# Patient Record
Sex: Female | Born: 1985 | Race: White | Hispanic: No | Marital: Married
Health system: Southern US, Community
[De-identification: ages and names within clinical notes are randomized; demographics above are authoritative.]

## PROBLEM LIST (undated history)

## (undated) DIAGNOSIS — F419 Anxiety disorder, unspecified: Secondary | ICD-10-CM

## (undated) DIAGNOSIS — C539 Malignant neoplasm of cervix uteri, unspecified: Secondary | ICD-10-CM

## (undated) DIAGNOSIS — N200 Calculus of kidney: Secondary | ICD-10-CM

## (undated) DIAGNOSIS — M549 Dorsalgia, unspecified: Secondary | ICD-10-CM

## (undated) DIAGNOSIS — F329 Major depressive disorder, single episode, unspecified: Secondary | ICD-10-CM

## (undated) DIAGNOSIS — F32A Depression, unspecified: Secondary | ICD-10-CM

## (undated) HISTORY — PX: KIDNEY STONE SURGERY: SHX686

## (undated) HISTORY — PX: ABDOMINAL HYSTERECTOMY: SHX81

## (undated) HISTORY — PX: RHINOPLASTY: SUR1284

## (undated) HISTORY — PX: TONSILLECTOMY: SUR1361

---
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
## 2009-01-02 ENCOUNTER — Encounter: Admission: RE | Admit: 2009-01-02 | Discharge: 2009-01-02 | Payer: Self-pay | Admitting: Family Medicine

## 2009-01-19 ENCOUNTER — Ambulatory Visit: Payer: Self-pay | Admitting: Internal Medicine

## 2009-01-26 ENCOUNTER — Ambulatory Visit: Payer: Self-pay | Admitting: Internal Medicine

## 2009-02-02 ENCOUNTER — Emergency Department (HOSPITAL_COMMUNITY): Admission: EM | Admit: 2009-02-02 | Discharge: 2009-02-02 | Payer: Self-pay | Admitting: Emergency Medicine

## 2009-02-04 ENCOUNTER — Inpatient Hospital Stay (HOSPITAL_COMMUNITY): Admission: EM | Admit: 2009-02-04 | Discharge: 2009-02-06 | Payer: Self-pay | Admitting: Emergency Medicine

## 2010-04-29 ENCOUNTER — Encounter (INDEPENDENT_AMBULATORY_CARE_PROVIDER_SITE_OTHER): Payer: Self-pay | Admitting: Obstetrics & Gynecology

## 2010-04-29 ENCOUNTER — Ambulatory Visit (HOSPITAL_COMMUNITY): Admission: RE | Admit: 2010-04-29 | Discharge: 2010-04-30 | Payer: Self-pay | Admitting: Obstetrics & Gynecology

## 2010-05-12 ENCOUNTER — Emergency Department (HOSPITAL_COMMUNITY): Admission: EM | Admit: 2010-05-12 | Discharge: 2010-05-12 | Payer: Self-pay | Admitting: Emergency Medicine

## 2010-07-18 NOTE — Discharge Summary (Addendum)
  NAMEMERRILEE, Ashley [name] NO.:  [id number]  MEDICAL RECORD NO.:  [id number]          PATIENT TYPE:  OIB  LOCATION:  9303                          FACILITY:  [facility]  PHYSICIAN:  Freddy Finner, M.D.   DATE OF BIRTH:  Jul 03, 1985  DATE OF ADMISSION:  04/29/2010 DATE OF DISCHARGE:  04/30/2010                              DISCHARGE SUMMARY   DISCHARGE DIAGNOSES:  Cervical intraepithelial neoplasia of cervix, history of pelvic pain, history of menorrhagia when intrauterine device not in place.  OPERATIVE PROCEDURE:  Laparoscopically-assisted vaginal hysterectomy.  DISPOSITION:  The patient is in satisfactory and improved condition on first postoperative day.  She was discharged home to restricted activity including vaginal rest.  No heavy lifting.  She is to call for fever or heavy vaginal bleeding.  She is to return to the office in approximately 1 week for postoperative followup.  She is given Percocet to be taken as needed for postoperative pain.  Surgical pathology is pending at the time of discharge.  The patient is known to have mild CIN on cervical biopsy.  The cervix will be, cut as a cone biopsy for further delineation of the degree of change.  Laboratory data during this admission includes a positive MRSA by PCR which was managed according to the standard protocol hospital.  On admission, CBC was completely normal with hemoglobin of 13. Postoperative hemoglobin was 10.1.  Details of the present illness, past history, family history, review of systems and physical exam were recorded in the admission note.  Briefly, the patient presented to my office with a request for definite surgical intervention for cervical intraepithelial neoplasia.  She is multiparas with 4 children.  Does not wish to ever have children again and feels confident that this is the best way of approaches from her perspective.  Her physical findings on admission were normal.  The uterus  itself had been slightly enlarged but no definite abnormality.  HOSPITAL COURSE:  The patient was admitted on the morning of surgery. She was given a bolus of Ancef.  She was placed in PAS hose.  She was brought to the operating room where she was placed under adequate general endotracheal anesthesia.  The above-described surgical procedure was accomplished without significant intraoperative complications.  Her postoperative course was entirely satisfactory.  She remained afebrile throughout her postoperative state.  After admission, she was ambulating without assistance, tolerating regular diet.  She was having adequate bowel and bladder function.  She was adequately relieved pain with Percocet which she is given for discharge.  She is to return to the office in approximately 1 week for postoperative followup.     Freddy Finner, M.D.     WRN/MEDQ  D:  04/30/2010  T:  05/01/2010  Job:  [id number]  Electronically Signed by W. NEAL M.D. on 07/18/2010 08:51:53 AM

## 2010-07-18 NOTE — H&P (Addendum)
  NAMEMOE, BRIER NO.:  [id number]  MEDICAL RECORD NO.:  [id number]         PATIENT TYPE:  WOIB  LOCATION:                                FACILITY:  [facility]  PHYSICIAN:  Freddy Finner, M.D.   DATE OF BIRTH:  Apr 29, 1986  DATE OF ADMISSION:  04/29/2010 DATE OF DISCHARGE:                             HISTORY & PHYSICAL   ADMISSION DIAGNOSIS:  Cervical intraepithelial neoplasia with a history of high-grade lesion and recent colposcopically directed biopsies showing a low-grade lesion.  The patient is a 25 year old with 4 living children who is using an IUD for contraception.  She was evaluated by her regular family physician and had a colposcopically directed biopsy showing low-grade CIN.  Pap smear has shown suggestion of a high-grade lesion.  The patient was seen in consultation by Dr. Sydnee Cabal and options of therapy discussed with her.  These options included cold-knife conization of the cervix or hysterectomy.  I have reviewed both with the patient in the office.  She adamantly states that she wishes to not have periods any longer and reduce her risk of cervical cancer.  She has requested strongly that she have a hysterectomy.  She is admitted now for laparoscopic-assisted vaginal hysterectomy.  Current review of systems is otherwise negative.  There are no cardiopulmonary, GI, or GU complaints.  The patient has had vaginal births.  No previous surgical procedures.  She is allergic to LATEX and had another product which I cannot identify.  She is on no medications on a chronic basis.  She does have a history of pelvic pain, but no documented pathology.  She has never had a blood transfusion.  FAMILY HISTORY:  Noncontributory.  PHYSICAL EXAM:  HEENT:  Grossly within normal limits. NECK:  Thyroid gland is not palpably enlarged. CHEST:  Clear to auscultation throughout. HEART:  Normal sinus rhythm without murmurs, rubs, or gallops. ABDOMEN:  Soft and  nontender without appreciable organomegaly or palpable masses. PELVIC:  Considered to be normal. External genitalia, vagina, and cervix are normal.  Her uterus is not palpably enlarged.  No palpable adnexal masses. RECTUM AND RECTOVAGINAL:  Confirmed the above findings.  ASSESSMENT:  Cervical intraepithelial neoplasia, multiparity, request for permanent sterilization, history of pelvic pain, request for hysterectomy as method of sterilization and resolution of problem.  The risk of hysterectomy have been discussed with the patient including risk of infection, blood clots, hemorrhage, and injury to other organs.  Prophylactic measures have also been discussed including serial compression devices and IV antibiotics.  She is admitted now, prepared to proceed with surgery.     Freddy Finner, M.D.     WRN/MEDQ  D:  04/26/2010  T:  04/27/2010  Job:  [id number]  Electronically Signed by W. NEAL M.D. on 07/18/2010 08:51:47 AM

## 2010-07-18 NOTE — Op Note (Addendum)
Ashley [name], Ashley [name] NO.:  [id number]  MEDICAL RECORD NO.:  [id number]          PATIENT TYPE:  OIB  LOCATION:  9303                          FACILITY:  [facility]  PHYSICIAN:  Freddy Finner, M.D.   DATE OF BIRTH:  08/26/1985  DATE OF PROCEDURE:  04/29/2010 DATE OF DISCHARGE:                              OPERATIVE REPORT   PREOPERATIVE DIAGNOSIS:  Cervical intraepithelial neoplasia of cervix.  POSTOPERATIVE DIAGNOSES:  Cervical intraepithelial neoplasia of cervix, clinical history of menorrhagia when intrauterine device not in place.  OPERATIVE PROCEDURES:  Laparoscopic-assisted vaginal hysterectomy and removal of intrauterine (contraceptive) device prior to the laparoscopic- assisted vaginal hysterectomy.  SURGEON:  Freddy Finner, MD  ASSISTANT:  Luvenia Redden, MD  ESTIMATED INTRAOPERATIVE BLOOD LOSS:  250 mL.  INTRAOPERATIVE COMPLICATIONS:  None.  DETAILS OF THE PRESENT ILLNESS RECORDED IN THE ADMISSION NOTE:  The patient was admitted on the morning of surgery.  She was placed in PAS hose.  She was given a bolus of Ancef IV.  She was brought to the operating room there and placed under adequate general endotracheal anesthesia, placed in the dorsal lithotomy position using the [place] stirrup system.  Betadine prep of abdomen, perineum, and vagina was carried out with scrub followed by solution.  Bladder was evacuated with a Robinson catheter.  Hulka tenaculum was attached to the cervix under direct visualization.  Sterile drapes were applied.  Two small incisions were made one at the umbilicus and one just above the symphysis. Through the upper incision, the disposable Veress needle was introduced and pneumoperitoneum was allowed to accumulate to at least 1 L of carbon dioxide.  The anterior abdominal wall was then manually elevated and an 11-mm trocar which was sharp disposable was introduced into the umbilicus without difficulty and with no evidence of  injury on entry. Pneumoperitoneum was allowed to continue accumulation.  A second trocar was placed in the lower incision.  This wound was a 5-mm ablated. Through it a blunt probe and spring-loaded grasping forceps and later the Nezhat irrigation system were used.  Systematic examination of the pelvic and abdominal contents was carried out.  Photographs were made. All intra-abdominal surfaces appeared to be normal.  The uterus may be slightly enlarged.  The ovaries and tubes were normal.  The appendix was normal.  The liver was normal.  No apparent abnormality was noted.  The tripolar Ethicon device was then __________ was introduced through the operating channel of the laparoscope and serial pedicles were developed, sealed and divided using this instrument.  Both the utero-ovarian ligament, the round ligament, the fallopian tube, the upper broad ligament on each side were progressively sealed and divided down to a level just above the uterine arteries.  Attention was then turned vaginally.  Posterior weighted vaginal retractor was placed.  Hulka was removed and replaced with a Christella Hartigan.  Colpotomy incision was made while tenting a mucosa posterior to the cervix.  The long blade narrow retractor was then placed.  The scalpel was then used to release the mucosa from the cervix.  The LigaSure device was then used to seal and  divide the uterosacral pedicles and bladder pillars.  The bladder was carefully advanced off the lower uterine segment.  Cardinal ligament pedicles were taken sealed and divided using the LigaSure.  Anterior peritoneum was then entered.  A vessel pedicles were then sealed and divided on each side.  An additional 1-2 pedicles on each side was required for complete resection of the uterus.  That was delivered through the vaginal introitus.  Angles of the vagina were then anchored to the uterosacrals with mattress sutures of 0 Monocryl.  Posterior peritoneum was closed and  uterosacrals plicated with a single interrupted 0 Monocryl suture.  Cuff was closed vertically with figure- of-eights of 0 Monocryl.  Foley catheter was placed.  Reinspection was carried out laparoscopically using the Nezhat system.  Approximately, 3 tiny bleeders were controlled with the Ethicon device.  Hemostasis was confirmed under reduced intra-abdominal pressure and irrigation. Irrigating solution was aspirated from the abdomen.  Gas was allowed to escape.  Incisions were anesthetized with 0.25% plain Marcaine and closed with interrupted subcuticular sutures of 3-0 Vicryl.  The Steri- Strips were applied to the lower incision.  The patient was awakened, taken to recovery in good condition.     Freddy Finner, M.D.     WRN/MEDQ  D:  04/29/2010  T:  04/29/2010  Job:  [id number]  Electronically Signed by Aden Sek. Lynzee Lindquist M.D. on 07/18/2010 08:51:49 AM

## 2010-09-03 LAB — SURGICAL PCR SCREEN: Staphylococcus aureus: POSITIVE — AB

## 2010-09-03 LAB — POCT I-STAT, CHEM 8
Calcium, Ion: 1.22 mmol/L (ref 1.12–1.32)
Creatinine, Ser: 0.9 mg/dL (ref 0.4–1.2)
Hemoglobin: 11.9 g/dL — ABNORMAL LOW (ref 12.0–15.0)
Sodium: 143 mEq/L (ref 135–145)
TCO2: 27 mmol/L (ref 0–100)

## 2010-09-03 LAB — CBC
MCH: 30.3 pg (ref 26.0–34.0)
MCHC: 34.3 g/dL (ref 30.0–36.0)
RDW: 12.6 % (ref 11.5–15.5)

## 2010-09-03 LAB — HEMOCCULT GUIAC POC 1CARD (OFFICE): Fecal Occult Bld: POSITIVE

## 2010-09-04 LAB — CBC
MCH: 29.8 pg (ref 26.0–34.0)
MCV: 88.2 fL (ref 78.0–100.0)
Platelets: 175 10*3/uL (ref 150–400)
RBC: 4.36 MIL/uL (ref 3.87–5.11)
RDW: 12.7 % (ref 11.5–15.5)

## 2010-09-28 LAB — BASIC METABOLIC PANEL
BUN: 13 mg/dL (ref 6–23)
Calcium: 8.5 mg/dL (ref 8.4–10.5)
Chloride: 107 mEq/L (ref 96–112)
Creatinine, Ser: 0.91 mg/dL (ref 0.4–1.2)
GFR calc Af Amer: >60 mL/min (ref >60)
GFR calc non Af Amer: >60 mL/min (ref >60)

## 2010-09-28 LAB — CBC
MCV: 86.2 fL (ref 78.0–100.0)
Platelets: 157 10*3/uL (ref 150–400)
RBC: 3.76 MIL/uL — ABNORMAL LOW (ref 3.87–5.11)
WBC: 4.1 10*3/uL (ref 4.0–10.5)

## 2010-09-28 LAB — HCG, QUANTITATIVE, PREGNANCY: hCG, Beta Chain, Quant, S: <2 m[IU]/mL (ref <5)

## 2010-09-29 LAB — CBC
MCHC: 34.9 g/dL (ref 30.0–36.0)
MCHC: 34.9 g/dL (ref 30.0–36.0)
MCV: 86.3 fL (ref 78.0–100.0)
RBC: 3.92 MIL/uL (ref 3.87–5.11)
RBC: 4.31 MIL/uL (ref 3.87–5.11)
RDW: 12.4 % (ref 11.5–15.5)
RDW: 12.6 % (ref 11.5–15.5)

## 2010-09-29 LAB — BASIC METABOLIC PANEL
CO2: 23 mEq/L (ref 19–32)
CO2: 26 mEq/L (ref 19–32)
Calcium: 8.9 mg/dL (ref 8.4–10.5)
Calcium: 9.1 mg/dL (ref 8.4–10.5)
Chloride: 111 mEq/L (ref 96–112)
Creatinine, Ser: 0.89 mg/dL (ref 0.4–1.2)
Creatinine, Ser: 0.92 mg/dL (ref 0.4–1.2)
GFR calc Af Amer: >60 mL/min (ref >60)
GFR calc Af Amer: >60 mL/min (ref >60)
GFR calc non Af Amer: >60 mL/min (ref >60)
Glucose, Bld: 88 mg/dL (ref 70–99)
Glucose, Bld: 98 mg/dL (ref 70–99)

## 2010-09-29 LAB — URINALYSIS, ROUTINE W REFLEX MICROSCOPIC
Leukocytes, UA: NEGATIVE
Leukocytes, UA: NEGATIVE
Nitrite: NEGATIVE
Nitrite: NEGATIVE
Specific Gravity, Urine: 1.02 (ref 1.005–1.030)
Specific Gravity, Urine: >1.03 — ABNORMAL HIGH (ref 1.005–1.030)
Urobilinogen, UA: 0.2 mg/dL (ref 0.0–1.0)
Urobilinogen, UA: 0.2 mg/dL (ref 0.0–1.0)

## 2010-09-29 LAB — DIFFERENTIAL
Basophils Absolute: 0 10*3/uL (ref 0.0–0.1)
Basophils Relative: 0 % (ref 0–1)
Lymphocytes Relative: 19 % (ref 12–46)
Neutro Abs: 3.9 10*3/uL (ref 1.7–7.7)
Neutrophils Relative %: 75 % (ref 43–77)

## 2010-09-29 LAB — PREGNANCY, URINE: Preg Test, Ur: NEGATIVE

## 2010-09-29 LAB — URINE MICROSCOPIC-ADD ON

## 2010-11-05 NOTE — H&P (Signed)
[name], [name]                 ACCOUNT NO.:  [id number]   MEDICAL RECORD NO.:  [id number]          PATIENT TYPE:  INP   LOCATION:  A212                          FACILITY:  [facility]   PHYSICIAN:  Ky Barban, M.D.DATE OF BIRTH:  07/06/1985   DATE OF ADMISSION:  02/04/2009  DATE OF DISCHARGE:  LH                              HISTORY & PHYSICAL   CHIEF COMPLAINT:  Recurrent left renal colic.   HISTORY:  This 25 year old female has severe pain in left flank on  Friday, came to the emergency room, was told she has a 7-mm stone, was  sent home, came back again with left renal colic and not feeling well.  No fever, chills, or voiding difficulty or gross hematuria.  CT scan  shows again 7-mm left distal ureteral calculus with hydroureter  nephrosis.  The patient is given pain medicine Dilaudid.  She has  itching because of that, but continued to have pain.  She is admitted  for further management and further control of this pain.   PAST MEDICAL HISTORY:  Except having a benign cyst removed on the skin,  no other significant medical problem.  She did require blood transfusion  during one of her normal delivery.   FAMILY HISTORY:  Negative.  No history of kidney stones.   PERSONAL HISTORY:  Does not smoke or drink.   REVIEW OF SYSTEMS:  Unremarkable.   PHYSICAL EXAMINATION:  GENERAL:  Well-nourished, well-developed female.  VITAL SIGNS:  Blood pressure 130/80, temperature is normal.  CENTRAL NERVOUS SYSTEM:  Negative.  HEAD, NECK, EYES, AND ENT:  Negative.  CHEST:  Symmetrical.  HEART:  Regular sinus rhythm.  No murmur.  ABDOMEN:  Soft and flat.  Liver, spleen, kidneys not palpable.  1+ left  CVA tenderness.  PELVIC:  Deferred.  EXTREMITIES:  Normal.   IMPRESSION:  Left ureteral calculus.   PLAN:  IV fluids, parenteral analgesia, and scheduled her for a  cystoscopy, left retrograde pyelogram, ureteroscopic stone basket under  anesthesia, holmium laser lithotripsy, and  possible double-J stent.  I  discussed various treatment options including:  1. Waiting.  2. ESL.  3. Stone basket and I discussed in detail stone basket complications,      ureteral perforation leading to open surgery.  She understands,      want me to go ahead and schedule.      Ky Barban, M.D.  Electronically Signed     MIJ/MEDQ  D:  02/04/2009  T:  02/05/2009  Job:  [id number]

## 2010-11-05 NOTE — Op Note (Signed)
NAMEGEORGIANNA, BAND                 ACCOUNT NO.:  [id number]   MEDICAL RECORD NO.:  [id number]          PATIENT TYPE:  INP   LOCATION:  A212                          FACILITY:  [facility]   PHYSICIAN:  Ky Barban, M.D.DATE OF BIRTH:  1986-02-02   DATE OF PROCEDURE:  DATE OF DISCHARGE:  02/06/2009                               OPERATIVE REPORT   PREOPERATIVE DIAGNOSIS:  Left ureteral calculus.   POSTOPERATIVE DIAGNOSIS:  Normal left retrograde pyelogram.  No stone.   PROCEDURE:  Cystoscopy, left retrograde pyelogram, left ureteral  renoscopy, insertion of double-J stent.   ANESTHESIA:  General.   SURGEON:  Ky Barban, MD   PROCEDURE:  The patient under general anesthesia in lithotomy position.  After usual prep and drape, #25 cystoscope introduced into the bladder.  Both ureteral orifices located at normal side with clear efflux of  bladder, grossly looks normal left ureteral orifice catheterized with a  wedge catheter Hypaque was injected under fluoroscopic control.  Dye  goes up into the upper ureter.  Upper ureter was normal in caliber.  Distal ureter was slightly distended, but I do not see any filling  defect in the entire ureter.  I decided to look into the ureter.  Guidewire was passed up into the renal pelvis.  Intramural ureter was  dilated with balloon dilator.  A #15 rigid ureteroscope was entered.  I  was able to go into the upper ureter, not all the way into the renal  pelvis.  I did not encounter any stone or any obstruction.  Rigid  ureteroscope was removed.  Then, I used flexible ureteroscope over the  guidewire.  I went all the way into the renal pelvis.  There was no  stone in the ureter.  I did not see any stone in the renal pelvis or the  calyces, and at this point the ureteroscope was removed.  I decided to  leave a double-J stent.  Double-J stent was positioned within the renal  pelvis and the bladder under fluoroscopic control.  The string has  been  removed.  All the instruments were removed.  The patient left the  operating room in satisfactory condition.      Ky Barban, M.D.  Electronically Signed     MIJ/MEDQ  D:  02/06/2009  T:  02/07/2009  Job:  [id number]

## 2012-04-19 ENCOUNTER — Encounter: Payer: Self-pay | Admitting: *Deleted

## 2012-04-19 ENCOUNTER — Emergency Department (INDEPENDENT_AMBULATORY_CARE_PROVIDER_SITE_OTHER): Payer: Medicaid Other

## 2012-04-19 ENCOUNTER — Emergency Department (INDEPENDENT_AMBULATORY_CARE_PROVIDER_SITE_OTHER)
Admission: EM | Admit: 2012-04-19 | Discharge: 2012-04-19 | Disposition: A | Discharge status: Home / Self Care | Payer: Medicaid Other | Source: Home / Self Care | Attending: Emergency Medicine | Admitting: Emergency Medicine

## 2012-04-19 DIAGNOSIS — M25579 Pain in unspecified ankle and joints of unspecified foot: Secondary | ICD-10-CM

## 2012-04-19 DIAGNOSIS — M25571 Pain in right ankle and joints of right foot: Secondary | ICD-10-CM

## 2012-04-19 MED ORDER — MELOXICAM 7.5 MG PO TABS: 7.5000 mg | ORAL_TABLET | Freq: Two times a day (BID) | ORAL | Status: DC | PRN

## 2012-04-19 NOTE — ED Provider Notes (Signed)
History     CSN: [id number]  Arrival date & time 04/19/12  1103   First MD Initiated Contact with Patient 04/19/12 1103      Chief Complaint  Patient presents with  . Ankle Injury    (Consider location/radiation/quality/duration/timing/severity/associated sxs/prior treatment) HPI This is a 26 year old white female who presents today with right ankle pain for the last 3 months.  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  She also reports that her mom has developed a cancer and she is having to take care of her a lot.  She has described a constant dull ache in her right ankle mostly on the medial aspect for the last few months.  However last night she was at the pumpkin patch and twisted her ankle and it got a lot worse at that time.  She has been using some Aleve and ibuprofen which is helping a little bit.  She has not used any braces.  She does not have any history of ankle injuries or trauma in the past.   History reviewed. No pertinent past medical history.  Past Surgical History  Procedure Date  . Abdominal hysterectomy     Family History  Problem Relation Age of Onset  . Cancer Mother     breast    History  Substance Use Topics  . Smoking status: Never Smoker   . Smokeless tobacco: Not on file  . Alcohol Use: No    OB History    Grav Para Term Preterm Abortions TAB SAB Ect Mult Living                  Review of Systems  All other systems reviewed and are negative.    Allergies  Latex and Penicillins  Home Medications   Current Outpatient Rx  Name Route Sig Dispense Refill  . MELOXICAM 7.5 MG PO TABS Oral Take 1 tablet (7.5 mg total) by mouth 2 (two) times daily as needed for pain (BID for 1 week, then PRN afterwards). 30 tablet 0    BP 120/79  Pulse 81  Temp 98.1 F (36.7 C) (Oral)  Resp 16  Ht 5\' 3"  (1.6 m)  Wt 198 lb (89.812 kg)  BMI 35.07 kg/m2  SpO2 99%  Physical Exam    Nursing note and vitals reviewed. Constitutional: She is oriented to person, place, and time. She appears well-developed and well-nourished.  HENT:  Head: Normocephalic and atraumatic.  Eyes: No scleral icterus.  Neck: Neck supple.  Cardiovascular: Regular rhythm and normal heart sounds.   Pulmonary/Chest: Effort normal and breath sounds normal. No respiratory distress.  Musculoskeletal:       R ankle/foot: FROM, +TTP globally, even to light touch.  Worse is medial malleolus and ATFL.   No TTP navicular, base of 5th, calcaneus, Achilles, proximal fibula.  No swelling.  No ecchymoses.  Distal neurovascular status is intact.    Neurological: She is alert and oriented to person, place, and time.  Skin: Skin is warm and dry.  Psychiatric: She has a normal mood and affect. Her speech is normal.    ED Course  Procedures (including critical care time)  Labs Reviewed - No data to display Dg Ankle Complete Right  04/19/2012  *RADIOLOGY REPORT*  Clinical Data: Medial pain for 3 months.  Gave way.  RIGHT ANKLE - COMPLETE 3+ VIEW  Comparison: None.  Findings: No evidence of fracture, dislocation, joint  effusion or other focal finding.  IMPRESSION: Normal radiographs.   Original Report Authenticated By: Thomasenia Sales, M.D.      1. Right ankle pain       MDM   An x-ray was obtained and read by the radiologist as above. Encourage rest, ice, compression with ACE bandage and/or a brace, and elevation of injured body part.  The role of anti-inflammatories is discussed with the patient.  Because of her chronic pain for a few months and her examination in which she seems to be very tender, I'm going to put her in a walking boot for the next 7-10 days.  I'm also put her anti-inflammatory medicine.  At that time, if she isn't improving, would like her to follow up with sports medicine.  Marlaine Hind, MD 04/19/12 1153

## 2012-04-19 NOTE — ED Notes (Signed)
Pt c/o RT ankle pain on and off x 3 mths, worse x this weekend. she has taken Aleve and IBF.

## 2012-05-07 ENCOUNTER — Other Ambulatory Visit (HOSPITAL_COMMUNITY): Payer: Self-pay | Admitting: Family Medicine

## 2012-05-07 DIAGNOSIS — Z139 Encounter for screening, unspecified: Secondary | ICD-10-CM

## 2012-05-11 ENCOUNTER — Ambulatory Visit (HOSPITAL_COMMUNITY)
Admission: RE | Admit: 2012-05-11 | Discharge: 2012-05-11 | Disposition: A | Discharge status: Home / Self Care | Payer: Medicaid Other | Source: Ambulatory Visit | Attending: Family Medicine | Admitting: Family Medicine

## 2012-05-11 DIAGNOSIS — Z139 Encounter for screening, unspecified: Secondary | ICD-10-CM

## 2012-05-11 DIAGNOSIS — Z1231 Encounter for screening mammogram for malignant neoplasm of breast: Secondary | ICD-10-CM | POA: Insufficient documentation

## 2014-03-28 ENCOUNTER — Encounter: Payer: Self-pay | Admitting: Internal Medicine

## 2014-04-10 DIAGNOSIS — Z0000 Encounter for general adult medical examination without abnormal findings: Secondary | ICD-10-CM | POA: Insufficient documentation

## 2014-04-10 DIAGNOSIS — G43719 Chronic migraine without aura, intractable, without status migrainosus: Secondary | ICD-10-CM | POA: Insufficient documentation

## 2014-09-01 ENCOUNTER — Encounter: Payer: Self-pay | Admitting: *Deleted

## 2014-09-01 ENCOUNTER — Emergency Department
Admission: EM | Admit: 2014-09-01 | Discharge: 2014-09-01 | Disposition: A | Discharge status: Home / Self Care | Payer: Medicaid Other | Source: Home / Self Care | Attending: Emergency Medicine | Admitting: Emergency Medicine

## 2014-09-01 ENCOUNTER — Emergency Department (INDEPENDENT_AMBULATORY_CARE_PROVIDER_SITE_OTHER): Payer: Medicaid Other

## 2014-09-01 DIAGNOSIS — M545 Low back pain, unspecified: Secondary | ICD-10-CM

## 2014-09-01 MED ORDER — MELOXICAM 7.5 MG PO TABS: 7.5000 mg | ORAL_TABLET | Freq: Every day | ORAL | Status: DC

## 2014-09-01 MED ORDER — HYDROCODONE-ACETAMINOPHEN 5-325 MG PO TABS: 2.0000 | ORAL_TABLET | ORAL | Status: DC | PRN

## 2014-09-01 NOTE — ED Provider Notes (Signed)
CSN: [id number]     Arrival date & time 09/01/14  1530 History   First MD Initiated Contact with Patient 09/01/14 1604     Chief Complaint  Patient presents with  . Back Pain   (Consider location/radiation/quality/duration/timing/severity/associated sxs/prior Treatment) Patient is a 29 y.o. female presenting with back pain. The history is provided by the patient. No language interpreter was used.  Back Pain Location:  Lumbar spine Quality:  Aching Radiates to:  Does not radiate Pain severity:  Moderate Pain is:  Same all the time Onset quality:  Gradual Duration:  2 weeks Timing:  Constant Progression:  Worsening Chronicity:  New Relieved by:  Nothing Worsened by:  Nothing tried Ineffective treatments:  None tried Risk factors: no hx of cancer     History reviewed. No pertinent past medical history. Past Surgical History  Procedure Laterality Date  . Abdominal hysterectomy     Family History  Problem Relation Age of Onset  . Cancer Mother     breast   History  Substance Use Topics  . Smoking status: Never Smoker   . Smokeless tobacco: Not on file  . Alcohol Use: No   OB History    No data available     Review of Systems  Musculoskeletal: Positive for back pain.  All other systems reviewed and are negative.   Allergies  Latex and Penicillins  Home Medications   Prior to Admission medications   Medication Sig Start Date End Date Taking? Authorizing Provider  HYDROcodone-acetaminophen (NORCO/VICODIN) 5-325 MG per tablet Take 2 tablets by mouth every 4 (four) hours as needed. 09/01/14   Fransico Meadow, PA-C  meloxicam (MOBIC) 7.5 MG tablet Take 1 tablet (7.5 mg total) by mouth daily. 09/01/14   Fransico Meadow, PA-C   BP 118/84 mmHg  Pulse 76  Resp 14  Wt 195 lb (88.451 kg)  SpO2 100% Physical Exam  Constitutional: She is oriented to person, place, and time. She appears well-developed and well-nourished.  HENT:  Head: Normocephalic.  Eyes: Conjunctivae  and EOM are normal. Pupils are equal, round, and reactive to light.  Neck: Normal range of motion.  Cardiovascular: Normal rate and normal heart sounds.   Pulmonary/Chest: Effort normal.  Abdominal: She exhibits no distension.  Musculoskeletal:  Tender ls spine diffusely, decreased range of motion.  Reflexes normal,   Ns intact   Neurological: She is alert and oriented to person, place, and time.  Skin: Skin is warm.  Psychiatric: She has a normal mood and affect.  Nursing note and vitals reviewed.   ED Course  Procedures (including critical care time) Labs Review Labs Reviewed - No data to display  Imaging Review Dg Lumbar Spine Complete  09/01/2014   CLINICAL DATA:  Low back pain. Worse on the right-side. Symptoms for 2 years. Recently worsening. No trauma history submitted.  EXAM: LUMBAR SPINE - COMPLETE 4+ VIEW  COMPARISON:  10/20/2012  FINDINGS: Five lumbar type vertebral bodies. Large colonic stool burden incidentally noted. Sacroiliac joints are symmetric. Maintenance of vertebral body height and alignment. Intervertebral disc heights are maintained.  IMPRESSION: No acute osseous abnormality.  Possible constipation.   Electronically Signed   By: Abigail Miyamoto M.D.   On: 09/01/2014 16:40     MDM   1. Bilateral low back pain without sciatica    meloxicam Hydrocodone  Pt advised to see Dr. Darene Lamer for evaluation     Fransico Meadow, PA-C 09/01/14 1823

## 2014-09-01 NOTE — Discharge Instructions (Signed)
Back Pain, Adult Low back pain is very common. About 1 in 5 people have back pain.The cause of low back pain is rarely dangerous. The pain often gets better over time.About half of people with a sudden onset of back pain feel better in just 2 weeks. About 8 in 10 people feel better by 6 weeks.  CAUSES Some common causes of back pain include:  Strain of the muscles or ligaments supporting the spine.  Wear and tear (degeneration) of the spinal discs.  Arthritis.  Direct injury to the back. DIAGNOSIS Most of the time, the direct cause of low back pain is not known.However, back pain can be treated effectively even when the exact cause of the pain is unknown.Answering your caregiver's questions about your overall health and symptoms is one of the most accurate ways to make sure the cause of your pain is not dangerous. If your caregiver needs more information, he or she may order lab work or imaging tests (X-rays or MRIs).However, even if imaging tests show changes in your back, this usually does not require surgery. HOME CARE INSTRUCTIONS For many people, back pain returns.Since low back pain is rarely dangerous, it is often a condition that people can learn to manageon their own.   Remain active. It is stressful on the back to sit or stand in one place. Do not sit, drive, or stand in one place for more than 30 minutes at a time. Take short walks on level surfaces as soon as pain allows.Try to increase the length of time you walk each day.  Do not stay in bed.Resting more than 1 or 2 days can delay your recovery.  Do not avoid exercise or work.Your body is made to move.It is not dangerous to be active, even though your back may hurt.Your back will likely heal faster if you return to being active before your pain is gone.  Pay attention to your body when you bend and lift. Many people have less discomfortwhen lifting if they bend their knees, keep the load close to their bodies,and  avoid twisting. Often, the most comfortable positions are those that put less stress on your recovering back.  Find a comfortable position to sleep. Use a firm mattress and lie on your side with your knees slightly bent. If you lie on your back, put a pillow under your knees.  Only take over-the-counter or prescription medicines as directed by your caregiver. Over-the-counter medicines to reduce pain and inflammation are often the most helpful.Your caregiver may prescribe muscle relaxant drugs.These medicines help dull your pain so you can more quickly return to your normal activities and healthy exercise.  Put ice on the injured area.  Put ice in a plastic bag.  Place a towel between your skin and the bag.  Leave the ice on for 15-20 minutes, 03-04 times a day for the first 2 to 3 days. After that, ice and heat may be alternated to reduce pain and spasms.  Ask your caregiver about trying back exercises and gentle massage. This may be of some benefit.  Avoid feeling anxious or stressed.Stress increases muscle tension and can worsen back pain.It is important to recognize when you are anxious or stressed and learn ways to manage it.Exercise is a great option. SEEK MEDICAL CARE IF:  You have pain that is not relieved with rest or medicine.  You have pain that does not improve in 1 week.  You have new symptoms.  You are generally not feeling well. SEEK   IMMEDIATE MEDICAL CARE IF:   You have pain that radiates from your back into your legs.  You develop new bowel or bladder control problems.  You have unusual weakness or numbness in your arms or legs.  You develop nausea or vomiting.  You develop abdominal pain.  You feel faint. Document Released: 06/09/2005 Document Revised: 12/09/2011 Document Reviewed: 10/11/2013 Exit[employer] Patient Information 2015 [employer]. This information is not intended to replace advice given to you by your health care provider. Make sure you  discuss any questions you have with your health care provider.  

## 2014-09-01 NOTE — ED Notes (Signed)
Caleigh reports 4 year history of low back pain that is intermittent. This instance has been going on for 2 weeks. Pain radiates to right leg. 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

## 2014-09-05 ENCOUNTER — Encounter: Payer: Self-pay | Admitting: Sports Medicine

## 2014-09-05 ENCOUNTER — Ambulatory Visit (INDEPENDENT_AMBULATORY_CARE_PROVIDER_SITE_OTHER): Payer: Medicaid Other | Admitting: Sports Medicine

## 2014-09-05 VITALS — BP 125/78 | HR 82 | Ht 62.0 in | Wt 197.0 lb

## 2014-09-05 DIAGNOSIS — F329 Major depressive disorder, single episode, unspecified: Secondary | ICD-10-CM | POA: Diagnosis not present

## 2014-09-05 DIAGNOSIS — M5416 Radiculopathy, lumbar region: Secondary | ICD-10-CM

## 2014-09-05 DIAGNOSIS — M5417 Radiculopathy, lumbosacral region: Secondary | ICD-10-CM | POA: Diagnosis not present

## 2014-09-05 DIAGNOSIS — F32A Depression, unspecified: Secondary | ICD-10-CM | POA: Insufficient documentation

## 2014-09-05 DIAGNOSIS — M7918 Myalgia, other site: Secondary | ICD-10-CM | POA: Insufficient documentation

## 2014-09-05 MED ORDER — PREDNISONE (PAK) 10 MG PO TABS: ORAL_TABLET | ORAL | Status: DC

## 2014-09-05 MED ORDER — DULOXETINE HCL 30 MG PO CPEP: 30.0000 mg | ORAL_CAPSULE | Freq: Every day | ORAL | Status: DC

## 2014-09-05 MED ORDER — KETOROLAC TROMETHAMINE 30 MG/ML IJ SOLN
30.0000 mg | Freq: Once | INTRAMUSCULAR | Status: AC
  Administered 2014-09-05: 30 mg via INTRAMUSCULAR

## 2014-09-05 MED ORDER — CYCLOBENZAPRINE HCL 10 MG PO TABS: ORAL_TABLET | ORAL | Status: DC

## 2014-09-05 NOTE — Assessment & Plan Note (Signed)
And has been on venlafaxine in the past, self discontinued due to headaches. I'm going to start Cymbalta as I certainly think there is a myofascial component. Her PCP can take this over.

## 2014-09-05 NOTE — Progress Notes (Signed)
Subjective:    I'm seeing this patient as a consultation for:  Dr. Dione Housekeeper  CC:  Lower back pain  HPI: Patient presents with complaint of low back pain which had been intermittent for 4 years, but became constant 2 weeks ago. The pain is severe and worsened with forward flexion, sitting and laughing. The pain sometimes radiates down her right leg and to the inside, outside, and sole of her foot. She has associated swelling over her lower back. She has tried ibuprofen, hydrocodone, and meloxicam but these have failed to relieve her pain. She has difficulty getting comfortable at night and props herself up with pillows to sleep. She was recently seen in Urgent Care where she had an XR Lumbar Spine 09/01/14 which showed no acute osseous abnormality. The patient denies any saddle anesthesia, LE weakness, incontinence of urine or feces.  Incidentally, CT abdomen pelvis 02/02/2009 did show some evidence of L4/L5 disk herniation and S1 spina bifida occulta.    When asked about her mood lately, the patient admits that she had been treated for depression in the past but she recently weaned herself off of her venlafaxine because it exacerbated her migraines. She would be amenable to trying a different medication if it could help her pain. The patient's husband adds that she has been quite irritable lately.  Past medical history, Surgical history, Family history not pertinant except as noted below, Social history, Allergies, and medications have been entered into the medical record, reviewed, and no changes needed.   Review of Systems: No headache, visual changes, nausea, vomiting, diarrhea, constipation, dizziness, abdominal pain, skin rash, fevers, chills, night sweats, weight loss, swollen lymph nodes, body aches, joint swelling, muscle aches, chest pain, shortness of breath, mood changes, visual or auditory hallucinations.   Objective:   General: Well Developed, well nourished, and in no acute  distress.  Neuro/Psych: Alert and oriented x3, extra-ocular muscles intact, able to move all 4 extremities, sensation grossly intact. Patient is tearful on exam. Skin: Warm and dry, no rashes noted.  Respiratory: Not using accessory muscles, speaking in full sentences, trachea midline.  Cardiovascular: Pulses palpable, no extremity edema. Abdomen: Does not appear distended. MSK: Back Exam:  Inspection: approximately 12 cm by 5 cm ovoid-shaped swelling is visible at the midline approximately at the level of the iliac crests. There is no erythema or ecchymosis. Motion: Flexion 45 deg, Extension 45 deg, Side Bending to 45 deg bilaterally. SLR laying: Positive XSLR laying: Negative  Palpable tenderness: most severe over the midline swelling, also over SI joints bilaterally. Sensory change: Sensation to fine touch diminished over sole and lateral aspect of right foot. Reflexes: 2+ at both patellar tendons, 2+ at achilles tendons. Strength at foot  Plantar-flexion: 5/5 Dorsi-flexion: 5/5 Eversion: 5/5 Inversion: 5/5  Leg strength  Quad: 5/5 Hamstring: 5/5 Hip flexor: 5/5 Hip abductors: 5/5  Gait unremarkable.   Impression and Recommendations:   This case required medical decision making of moderate complexity.  # Lumbar Radiculopathy - The patient's symptoms correspond to the distribution of L4/5 and L5/S1 nerve roots, likely due to herniated disk and perhaps with some contribution from spina bifida. - In light of S1 spina bifida visible on CT abdomen pelvis 02/02/2009 will obtain MRI now. - Patient referred for formal physical therapy - Patient given Toradol 30 mg IV in office today - Continue meloxicam 7.5 mg daily  - Begin cyclobenzaprine 10 mg QHS  - Begin prednisone 10 mg and taper over 12 days  #  Depression - Patient with known history of depression, which may be contributing to the severity of her pain - Begin duloxetine 30 mg daily  - Patient to follow up management of depression  with her PCP  Follow up in 4 weeks or sooner as needed

## 2014-09-05 NOTE — Assessment & Plan Note (Signed)
Toradol 30. Prednisone taper, continue meloxicam, adding Flexeril. Formal physical therapy. CT scan from 2010 did show what appeared to be S1 spina bifida occulta. She also had L4-5 and L5-S1 disc protrusions She does have some swelling over her low back, for this reason we are going to obtain an MRI sooner and then later. Return in one month.

## 2014-09-11 ENCOUNTER — Ambulatory Visit (INDEPENDENT_AMBULATORY_CARE_PROVIDER_SITE_OTHER): Payer: Medicaid Other

## 2014-09-11 DIAGNOSIS — M5416 Radiculopathy, lumbar region: Secondary | ICD-10-CM

## 2014-09-11 DIAGNOSIS — Q058 Sacral spina bifida without hydrocephalus: Secondary | ICD-10-CM

## 2014-09-12 ENCOUNTER — Ambulatory Visit (INDEPENDENT_AMBULATORY_CARE_PROVIDER_SITE_OTHER): Payer: Medicaid Other | Admitting: Sports Medicine

## 2014-09-12 ENCOUNTER — Encounter: Payer: Self-pay | Admitting: Sports Medicine

## 2014-09-12 ENCOUNTER — Ambulatory Visit: Payer: Medicaid Other | Admitting: Rehabilitation

## 2014-09-12 DIAGNOSIS — M791 Myalgia: Secondary | ICD-10-CM

## 2014-09-12 DIAGNOSIS — M7918 Myalgia, other site: Secondary | ICD-10-CM

## 2014-09-12 MED ORDER — PREGABALIN 75 MG PO CAPS: 75.0000 mg | ORAL_CAPSULE | Freq: Two times a day (BID) | ORAL | Status: DC

## 2014-09-12 NOTE — Assessment & Plan Note (Signed)
MRI is predominantly unremarkable with the exception of mild desiccation of the L4-L5 disc with mild protrusion but does not appear to come in contact with the exiting L4 nerve roots. There is likely asymptomatic spina bifida occulta at the S1 level. Continue Cymbalta, meloxicam as needed. I am going to add Lyrica for use twice a day. Samples given.

## 2014-09-12 NOTE — Progress Notes (Signed)
  Subjective:    CC:  Follow-up  HPI: Low back pain: Ashley [name] is now noting pain on both shoulders, bilateral back, and both hips, so painful that simple light touch on her skin  Causes excruciating pain. She recently had an MRI,  The results of which we dictated below. She denies any true radicular symptoms. No bowel or bladder dysfunction, no constitutional symptoms. She has not yet noted any improvement with baclofen, Flexeril has been minimally helpful, as has meloxicam. She has not yet started  To feel a response from Cymbalta. Symptoms are moderate, persistent.  Past medical history, Surgical history, Family history not pertinant except as noted below, Social history, Allergies, and medications have been entered into the medical record, reviewed, and no changes needed.   Review of Systems: No fevers, chills, night sweats, weight loss, chest pain, or shortness of breath.   Objective:    General: Well Developed, well nourished, and in no acute distress.  Neuro: Alert and oriented x3, extra-ocular muscles intact, sensation grossly intact.  HEENT: Normocephalic, atraumatic, pupils equal round reactive to light, neck supple, no masses, no lymphadenopathy, thyroid nonpalpable.  Skin: Warm and dry, no rashes. Cardiac: Regular rate and rhythm, no murmurs rubs or gallops, no lower extremity edema.  Respiratory: Clear to auscultation bilaterally. Not using accessory muscles, speaking in full sentences. Back Exam:  Inspection: Unremarkable  Motion: Flexion 45 deg, Extension 45 deg, Side Bending to 45 deg bilaterally,  Rotation to 45 deg bilaterally  SLR laying: Negative  XSLR laying: Negative  Palpable tenderness: exquisite tenderness to palpation even with light touch on the neck, shoulders, and back. FABER: negative. Sensory change: Gross sensation intact to all lumbar and sacral dermatomes.  Reflexes: 2+ at both patellar tendons, 2+ at achilles tendons, Babinski's downgoing.  Strength at foot    Plantar-flexion: 5/5 Dorsi-flexion: 5/5 Eversion: 5/5 Inversion: 5/5  Leg strength  Quad: 5/5 Hamstring: 5/5 Hip flexor: 5/5 Hip abductors: 5/5  Gait unremarkable.  MRI reviewed and shows an L4-L5 degenerative disc disease with mild protrusion, does not appear to be any contact with the L4 nerve roots, there is also spina bifida occulta at the S1 level without meningocele or meningomyelocele.  Impression and Recommendations:

## 2014-09-20 ENCOUNTER — Ambulatory Visit: Payer: Medicaid Other | Attending: Sports Medicine | Admitting: Physical Therapy

## 2014-10-09 DIAGNOSIS — M5136 Other intervertebral disc degeneration, lumbar region: Secondary | ICD-10-CM | POA: Insufficient documentation

## 2014-10-09 DIAGNOSIS — F3341 Major depressive disorder, recurrent, in partial remission: Secondary | ICD-10-CM | POA: Insufficient documentation

## 2014-10-09 DIAGNOSIS — F411 Generalized anxiety disorder: Secondary | ICD-10-CM | POA: Insufficient documentation

## 2014-10-09 DIAGNOSIS — M5126 Other intervertebral disc displacement, lumbar region: Secondary | ICD-10-CM | POA: Insufficient documentation

## 2014-10-17 ENCOUNTER — Ambulatory Visit: Payer: Medicaid Other | Admitting: Sports Medicine

## 2015-02-20 DIAGNOSIS — K921 Melena: Secondary | ICD-10-CM | POA: Insufficient documentation

## 2015-02-20 DIAGNOSIS — K5900 Constipation, unspecified: Secondary | ICD-10-CM | POA: Insufficient documentation

## 2015-07-25 DIAGNOSIS — Z9089 Acquired absence of other organs: Secondary | ICD-10-CM | POA: Insufficient documentation

## 2016-02-05 ENCOUNTER — Encounter: Payer: Self-pay | Admitting: *Deleted

## 2016-12-16 DIAGNOSIS — F439 Reaction to severe stress, unspecified: Secondary | ICD-10-CM | POA: Insufficient documentation

## 2017-02-26 DIAGNOSIS — J069 Acute upper respiratory infection, unspecified: Secondary | ICD-10-CM | POA: Insufficient documentation

## 2017-08-28 ENCOUNTER — Encounter (HOSPITAL_BASED_OUTPATIENT_CLINIC_OR_DEPARTMENT_OTHER): Payer: Self-pay | Admitting: *Deleted

## 2017-08-28 ENCOUNTER — Other Ambulatory Visit: Payer: Self-pay

## 2017-08-28 ENCOUNTER — Emergency Department (HOSPITAL_BASED_OUTPATIENT_CLINIC_OR_DEPARTMENT_OTHER)
Admission: EM | Admit: 2017-08-28 | Discharge: 2017-08-28 | Disposition: A | Discharge status: Home / Self Care | Payer: 59 | Attending: Emergency Medicine | Admitting: Emergency Medicine

## 2017-08-28 ENCOUNTER — Emergency Department (HOSPITAL_BASED_OUTPATIENT_CLINIC_OR_DEPARTMENT_OTHER): Payer: 59

## 2017-08-28 DIAGNOSIS — Z91040 Latex allergy status: Secondary | ICD-10-CM | POA: Diagnosis not present

## 2017-08-28 DIAGNOSIS — Z79899 Other long term (current) drug therapy: Secondary | ICD-10-CM | POA: Insufficient documentation

## 2017-08-28 DIAGNOSIS — M79671 Pain in right foot: Secondary | ICD-10-CM | POA: Diagnosis present

## 2017-08-28 DIAGNOSIS — M722 Plantar fascial fibromatosis: Secondary | ICD-10-CM

## 2017-08-28 HISTORY — DX: Depression, unspecified: F32.A

## 2017-08-28 HISTORY — DX: Major depressive disorder, single episode, unspecified: F32.9

## 2017-08-28 MED ORDER — NAPROXEN 500 MG PO TABS: 500.0000 mg | ORAL_TABLET | Freq: Two times a day (BID) | ORAL | 0 refills | Status: DC

## 2017-08-28 NOTE — Discharge Instructions (Signed)
Plantar fasciitis is a painful foot condition that affects the heel. It occurs when the band of tissue that connects the toes to the heel bone (plantar fascia) becomes irritated. This can happen after exercising too much or doing other repetitive activities (overuse injury). The pain from plantar fasciitis can range from mild irritation to severe pain that makes it difficult for you to walk or move. The pain is usually worse in the morning or after you have been sitting or lying down for a while.  Follow attached handout.   Follow and perform stretches 3x per day. Especially when you first wake up in the morning.   Follow up with PCP for your symptoms.

## 2017-08-28 NOTE — ED Triage Notes (Signed)
Pain in the ball of her right foot for a week. No injury. Swelling.

## 2017-08-28 NOTE — ED Provider Notes (Signed)
[facility]PART[facility] Provider Note   CSN: [id number] Arrival date & time: 08/28/17  2101     History   Chief Complaint Chief Complaint  Patient presents with  . Foot Pain    HPI Ashley [name] is a 32 y.o. female who presents to the emergency department today for right foot pain.  Patient states that over the last 1-2 weeks she has been having pain in the arch of her feet when she first gets out of bed.  She notes this is a ripping, tearing sensation that is worsened by ambulation.  She notes that after she ambulates for long period of time that the pain does slightly improved.  She has not tried any over-the-counter medications, rest, ice, compression or elevation for this.  She notes that she feels there is swelling to the ball of her right foot now.  She has never had anything like this before.  She denies any fever, trauma, injury, history of gout, ankle pain, numbness/tingling/weakness.  HPI  Past Medical History:  Diagnosis Date  . Depression     Patient Active Problem List   Diagnosis Date Noted  . Myofascial pain syndrome 09/05/2014  . Depression 09/05/2014    Past Surgical History:  Procedure Laterality Date  . ABDOMINAL HYSTERECTOMY    . TONSILLECTOMY      OB History    No data available       Home Medications    Prior to Admission medications   Medication Sig Start Date End Date Taking? Authorizing Provider  clonazePAM (KLONOPIN) 1 MG tablet Take 1 mg by mouth 2 (two) times daily.    [provider]  cyclobenzaprine (FLEXERIL) 10 MG tablet One half tab PO qHS, then increase gradually to one tab TID. 09/05/14   Silverio Decamp, MD  DULoxetine (CYMBALTA) 30 MG capsule Take 1 capsule (30 mg total) by mouth daily. 09/05/14   Silverio Decamp, MD  pregabalin (LYRICA) 75 MG capsule Take 1 capsule (75 mg total) by mouth 2 (two) times daily. 09/12/14   Silverio Decamp, MD    Family History Family History    Problem Relation Age of Onset  . Cancer Mother        breast    Social History Social History   Tobacco Use  . Smoking status: Never Smoker  . Smokeless tobacco: Never Used  Substance Use Topics  . Alcohol use: No  . Drug use: No     Allergies   Latex and Penicillins   Review of Systems Review of Systems  All other systems reviewed and are negative.    Physical Exam Updated Vital Signs BP 123/76   Pulse 74   Temp 98.3 F (36.8 C) (Oral)   Resp 20   Ht 5\' 2"  (1.575 m)   Wt 90.7 kg (200 lb)   SpO2 100%   BMI 36.58 kg/m   Physical Exam  Constitutional: She appears well-developed and well-nourished.  HENT:  Head: Normocephalic and atraumatic.  Right Ear: External ear normal.  Left Ear: External ear normal.  Eyes: Conjunctivae are normal. Right eye exhibits no discharge. Left eye exhibits no discharge. No scleral icterus.  Cardiovascular:  Pulses:      Dorsalis pedis pulses are 2+ on the right side, and 2+ on the left side.       Posterior tibial pulses are 2+ on the right side, and 2+ on the left side.  No lower extremity edema or swelling.  Calves  are symmetric in size bilaterally without tenderness.  Negative Homans.  Pulmonary/Chest: Effort normal. No respiratory distress.  Musculoskeletal:       Right knee: Normal.       Right ankle: Normal. Achilles tendon normal.       Right foot: There is tenderness. There is normal range of motion, no bony tenderness, no swelling and normal capillary refill.       Feet:  Neurological: She is alert.  Skin: No pallor.  Psychiatric: She has a normal mood and affect.  Nursing note and vitals reviewed.    ED Treatments / Results  Labs (all labs ordered are listed, but only abnormal results are displayed) Labs Reviewed - No data to display  EKG  EKG Interpretation None       Radiology Dg Ankle Complete Right  Result Date: 08/28/2017 CLINICAL DATA:  acute onset right ankle swelling and right heel pain x 1  wk w/o injury. EXAM: RIGHT ANKLE - COMPLETE 3+ VIEW COMPARISON:  04/19/2012 FINDINGS: No fracture.  No bone lesion. The ankle mortise is normally spaced and aligned. No arthropathic changes. There is a small dorsal calcaneal spur. Soft tissues are unremarkable. IMPRESSION: 1. No fracture, bone lesion or ankle joint abnormality. 2. Small dorsal calcaneal spur. Electronically Signed   By: Lajean Manes M.D.   On: 08/28/2017 21:37    Procedures Procedures (including critical care time)  Medications Ordered in ED Medications - No data to display   Initial Impression / Assessment and Plan / ED Course  I have reviewed the triage vital signs and the nursing notes.  Pertinent labs & imaging results that were available during my care of the patient were reviewed by me and considered in my medical decision making (see chart for details).     32 y.o. female exam and history consistent with plantar fasciitis.  X-ray done in triage without evidence of fracture.  Will treat the patient conservatively.  Information on stretches provided.  Will give referral over to orthopedics if symptoms do not improve. Patient is to follow up with PCP otherwise. Specific return precautions discussed. Time was given for all questions to be answered. The patient verbalized understanding and agreement with plan. The patient appears safe for discharge home.  Final Clinical Impressions(s) / ED Diagnoses   Final diagnoses:  Plantar fasciitis    ED Discharge Orders        Ordered    naproxen (NAPROSYN) 500 MG tablet  2 times daily     08/28/17 2218       Lorelle Gibbs 08/28/17 2219    Fatima Blank, MD 08/28/17 [phone]

## 2017-08-28 NOTE — ED Notes (Signed)
Pt was not in the room upon entering to discharge her.

## 2017-08-31 ENCOUNTER — Encounter: Payer: Self-pay | Admitting: Podiatry

## 2017-08-31 ENCOUNTER — Ambulatory Visit (INDEPENDENT_AMBULATORY_CARE_PROVIDER_SITE_OTHER): Payer: 59

## 2017-08-31 ENCOUNTER — Ambulatory Visit (INDEPENDENT_AMBULATORY_CARE_PROVIDER_SITE_OTHER): Payer: 59 | Admitting: Podiatry

## 2017-08-31 DIAGNOSIS — M722 Plantar fascial fibromatosis: Secondary | ICD-10-CM

## 2017-08-31 MED ORDER — TRAMADOL HCL 50 MG PO TABS: 50.0000 mg | ORAL_TABLET | Freq: Three times a day (TID) | ORAL | 0 refills | Status: DC | PRN

## 2017-08-31 MED ORDER — MELOXICAM 15 MG PO TABS: 15.0000 mg | ORAL_TABLET | Freq: Every day | ORAL | 1 refills | Status: AC

## 2017-08-31 MED ORDER — METHYLPREDNISOLONE 4 MG PO TBPK: ORAL_TABLET | ORAL | 0 refills | Status: DC

## 2017-08-31 NOTE — Progress Notes (Signed)
   Subjective:    Patient ID: Ashley [name], female    DOB: [birth year], 32 y.o.   MRN: [id number]  HPI    Review of Systems  All other systems reviewed and are negative.      Objective:   Physical Exam        Assessment & Plan:

## 2017-09-01 NOTE — Progress Notes (Signed)
   Subjective: 32 year old female presents today as a new patient with a chief complaint of intermittent pain and tenderness of the bilateral plantar forefoot, arch and heel that began two years ago. She states the pain is worse in the right foot. She reports associated swelling of the right foot. She has been taking Naproxen with no significant relief. She states that it hurts in the morning with the first steps out of bed. Patient presents today for further treatment and evaluation.   Past Medical History:  Diagnosis Date  . Depression      Objective: Physical Exam General: The patient is alert and oriented x3 in no acute distress.  Dermatology: Skin is warm, dry and supple bilateral lower extremities. Negative for open lesions or macerations bilateral.   Vascular: Dorsalis Pedis and Posterior Tibial pulses palpable bilateral.  Capillary fill time is immediate to all digits.  Neurological: Epicritic and protective threshold intact bilateral.   Musculoskeletal: Tenderness to palpation to the plantar aspect of the bilateral heels along the plantar fascia. All other joints range of motion within normal limits bilateral. Strength 5/5 in all groups bilateral.   Radiographic exam: Normal osseous mineralization. Joint spaces preserved. No fracture/dislocation/boney destruction. No other soft tissue abnormalities or radiopaque foreign bodies.   Assessment: 1. plantar fasciitis bilateral feet - insertion and midsubstance   Plan of Care:  1. Patient evaluated. Xrays reviewed.   2. Injection of 0.5cc Celestone soluspan injected into the bilateral heels.  3. Rx for Medrol Dose Pak placed 4. Rx for Meloxicam ordered for patient. 5. Compression anklets dispensed for bilateral ankles. 6. Instructed patient regarding therapies and modalities at home to alleviate symptoms.  7. Prescription for Tramadol 50 mg #30 provided to patient.  8. Return to clinic in 4 weeks. Patient will need custom  molded orthotics at some point once her inflammation has improved.   Adult nurse at [employer].     Edrick Kins, DPM [facility]  Dr. Edrick Kins, DPM    [address], [place] [address]                Office [phone]  Fax [phone]

## 2017-09-10 ENCOUNTER — Ambulatory Visit (INDEPENDENT_AMBULATORY_CARE_PROVIDER_SITE_OTHER): Payer: 59 | Admitting: Family Medicine

## 2017-09-10 ENCOUNTER — Encounter: Payer: Self-pay | Admitting: Family Medicine

## 2017-09-10 VITALS — BP 135/84 | HR 68 | Ht 62.0 in | Wt 200.0 lb

## 2017-09-10 DIAGNOSIS — G8929 Other chronic pain: Secondary | ICD-10-CM | POA: Diagnosis not present

## 2017-09-10 DIAGNOSIS — M545 Low back pain: Secondary | ICD-10-CM | POA: Diagnosis not present

## 2017-09-10 DIAGNOSIS — M79605 Pain in left leg: Secondary | ICD-10-CM

## 2017-09-10 DIAGNOSIS — M79604 Pain in right leg: Secondary | ICD-10-CM

## 2017-09-10 MED ORDER — NORTRIPTYLINE HCL 25 MG PO CAPS: 25.0000 mg | ORAL_CAPSULE | Freq: Every day | ORAL | 2 refills | Status: DC

## 2017-09-10 NOTE — Patient Instructions (Addendum)
We will go ahead with nerve conduction studies of both your lower extremities from the back down. I'm concerned you have nerve damage dating back to the spinal tap you had 12 years ago. Try nortriptyline 25mg  at bedtime. I would stop the tramadol while you're on the nortriptyline but you can take meloxicam daily. Ok to take tylenol with the nortriptyline as well. I will call you with the test results and next steps.

## 2017-09-13 ENCOUNTER — Encounter: Payer: Self-pay | Admitting: Family Medicine

## 2017-09-13 DIAGNOSIS — M545 Low back pain: Secondary | ICD-10-CM | POA: Insufficient documentation

## 2017-09-13 DIAGNOSIS — M79605 Pain in left leg: Secondary | ICD-10-CM | POA: Insufficient documentation

## 2017-09-13 DIAGNOSIS — M79604 Pain in right leg: Secondary | ICD-10-CM | POA: Insufficient documentation

## 2017-09-13 NOTE — Assessment & Plan Note (Signed)
MRI reviewed from September 11, 2014 showing that while she has incidental spina bifida at S1, there is nothing by MRI to account for her symptoms.  Exam is otherwise reassuring aside from diffuse low back tenderness.  Advised to go ahead with nerve conduction studies and EMGs of the bilateral lower extremities.  We will also try nortriptyline 25 mg at bedtime.  She is advised to stop tramadol while taking this.  Also advised is okay to take Tylenol with this.  Will call with test results and next steps.

## 2017-09-13 NOTE — Progress Notes (Signed)
PCP: Dione Housekeeper, MD  Subjective:   HPI: Patient is a 32 y.o. female here for low back pain.  Following the birth of her son when she was 59 years old she had a spinal tap that "messed me up" Patient also reports that she used to be a hairdresser and both these caused/worsened her low back pain. She states the pain is across the center of her low back that radiates into both of her hips and knees. Pain is currently a 3 out of 10 though she can feel it up to 5 out of 10 mainly into the left leg and knee. She reports associated localized swelling. She is tried heat and ice. She reports having had an MRI of her lumbar spine a couple years ago and after this had injections which did not provide relief. She is also recently been treated for plantar fasciitis about 1 week ago and had injections in the plantar aspect of her heels. She is currently taking meloxicam with minimal improvement. She has also tried tramadol, Lyrica, naproxen, and just finished a Medrol Dosepak for plantar fasciitis No bowel/bladder dysfunction.  Past Medical History:  Diagnosis Date  . Depression     Current Outpatient Medications on File Prior to Visit  Medication Sig Dispense Refill  . clonazePAM (KLONOPIN) 1 MG tablet Take 1 mg by mouth 2 (two) times daily.    . cyclobenzaprine (FLEXERIL) 10 MG tablet One half tab PO qHS, then increase gradually to one tab TID. 30 tablet 0  . DULoxetine (CYMBALTA) 30 MG capsule Take 1 capsule (30 mg total) by mouth daily. 30 capsule 1  . meloxicam (MOBIC) 15 MG tablet Take 1 tablet (15 mg total) by mouth daily. 60 tablet 1  . methylPREDNISolone (MEDROL DOSEPAK) 4 MG TBPK tablet 6 day dose pack - take as directed 21 tablet 0  . naproxen (NAPROSYN) 500 MG tablet Take 1 tablet (500 mg total) by mouth 2 (two) times daily. 30 tablet 0  . pregabalin (LYRICA) 75 MG capsule Take 1 capsule (75 mg total) by mouth 2 (two) times daily. 28 capsule 0  . traMADol (ULTRAM) 50 MG tablet  Take 1 tablet (50 mg total) by mouth every 8 (eight) hours as needed. 30 tablet 0   No current facility-administered medications on file prior to visit.     Past Surgical History:  Procedure Laterality Date  . ABDOMINAL HYSTERECTOMY    . TONSILLECTOMY      Allergies  Allergen Reactions  . Codeine Anaphylaxis  . Latex Rash  . Levofloxacin Hives and Nausea And Vomiting  . Oxycodone Anaphylaxis  . Penicillins Hives    Social History   Socioeconomic History  . Marital status: Married    Spouse name: Not on file  . Number of children: Not on file  . Years of education: Not on file  . Highest education level: Not on file  Occupational History  . Not on file  Social Needs  . Financial resource strain: Not on file  . Food insecurity:    Worry: Not on file    Inability: Not on file  . Transportation needs:    Medical: Not on file    Non-medical: Not on file  Tobacco Use  . Smoking status: Never Smoker  . Smokeless tobacco: Never Used  Substance and Sexual Activity  . Alcohol use: No  . Drug use: No  . Sexual activity: Not on file  Lifestyle  . Physical activity:    Days per week:  Not on file    Minutes per session: Not on file  . Stress: Not on file  Relationships  . Social connections:    Talks on phone: Not on file    Gets together: Not on file    Attends religious service: Not on file    Active member of club or organization: Not on file    Attends meetings of clubs or organizations: Not on file    Relationship status: Not on file  . Intimate partner violence:    Fear of current or ex partner: Not on file    Emotionally abused: Not on file    Physically abused: Not on file    Forced sexual activity: Not on file  Other Topics Concern  . Not on file  Social History Narrative  . Not on file    Family History  Problem Relation Age of Onset  . Cancer Mother        breast    BP 135/84   Pulse 68   Ht 5\' 2"  (1.575 m)   Wt 200 lb (90.7 kg)   BMI 36.58  kg/m   Review of Systems: See HPI above.     Objective:  Physical Exam:  Gen: NAD, comfortable in exam room  Back: No gross deformity, scoliosis. TTP diffusely across low back.  ROM limited to 5 degrees extension, 45 flexion with pain. Strength LEs 5/5 all muscle groups.   2+ MSRs in patellar and achilles tendons, equal bilaterally. Negative SLRs. Sensation diminished bilateral lower extremities.  Bilateral hips: No deformity. TTP over bilateral greater trochanters.  No other tenderness. FROM with 5/5 strength. Negative logroll bilateral hips Negative fabers and piriformis stretches.  Assessment & Plan:  1. Low back pain with radiation into both lower extremities: MRI reviewed from September 11, 2014 showing that while she has incidental spina bifida at S1, there is nothing by MRI to account for her symptoms.  Exam is otherwise reassuring aside from diffuse low back tenderness.  Advised to go ahead with nerve conduction studies and EMGs of the bilateral lower extremities.  We will also try nortriptyline 25 mg at bedtime.  She is advised to stop tramadol while taking this.  Also advised is okay to take Tylenol with this.  Will call with test results and next steps.

## 2017-09-24 ENCOUNTER — Encounter (INDEPENDENT_AMBULATORY_CARE_PROVIDER_SITE_OTHER): Payer: 59 | Admitting: Diagnostic Neuroimaging

## 2017-09-24 ENCOUNTER — Ambulatory Visit (INDEPENDENT_AMBULATORY_CARE_PROVIDER_SITE_OTHER): Payer: 59 | Admitting: Diagnostic Neuroimaging

## 2017-09-24 DIAGNOSIS — Z0289 Encounter for other administrative examinations: Secondary | ICD-10-CM

## 2017-09-24 DIAGNOSIS — M79604 Pain in right leg: Secondary | ICD-10-CM | POA: Diagnosis not present

## 2017-09-24 DIAGNOSIS — M79605 Pain in left leg: Secondary | ICD-10-CM

## 2017-09-25 NOTE — Procedures (Signed)
[facility]  NCS (NERVE CONDUCTION STUDY) WITH EMG (ELECTROMYOGRAPHY) REPORT   STUDY DATE: 09/24/17 PATIENT NAME: Ashley [name] DOB: 1986/01/15 MRN: [id number]  ORDERING CLINICIAN: Ericka Pontiff, MD  TECHNOLOGIST: Oneita Jolly ELECTROMYOGRAPHER: Earlean Polka. Iyana Topor, MD  CLINICAL INFORMATION: 32 year old female with low back pain and lower extremity pain / numbness.  FINDINGS: NERVE CONDUCTION STUDY: Bilateral peroneal and tibial motor responses are normal.  Bilateral sural sensory responses normal.  NEEDLE ELECTROMYOGRAPHY: Needle examination of right lower extremity vastus medialis, tibialis anterior, gastrocnemius, bilateral lumbar paraspinal muscles shows 1+ positive sharp waves in the right lumbar paraspinal region.  Otherwise unremarkable.   IMPRESSION:   Overall unremarkable electrodiagnostic study.  No definite evidence of large fiber neuropathy at this time.  Subtle spontaneous activity noted in right lower lumbar paraspinal muscles could reflect mild nerve root irritation.  No additional supportive evidence for lumbar radiculopathy at this time.     INTERPRETING PHYSICIAN:  Penni Bombard, MD Certified in Neurology, Neurophysiology and Neuroimaging  [facility] [address] [place], [place] [address] [phone]   [facility]    Nerve / Sites Muscle Latency Ref. Amplitude Ref. Rel Amp Segments Distance Velocity Ref. Area    ms ms mV mV %  cm m/s m/s mVms  R Peroneal - EDB     Ankle EDB 4.0 ?6.5 6.8 ?2.0 100 Ankle - EDB 9   20.9     Fib head EDB 8.6  6.3  92.8 Fib head - Ankle 28 60 ?44 20.9     Pop fossa EDB 10.2  5.8  92 Pop fossa - Fib head 10 62 ?44 20.6         Pop fossa - Ankle      L Peroneal - EDB     Ankle EDB 3.8 ?6.5 7.1 ?2.0 100 Ankle - EDB 9   22.6     Fib head EDB 8.6  6.6  93 Fib head - Ankle 28 58 ?44 22.2     Pop fossa EDB 10.5  5.9  89 Pop fossa - Fib head 10 55 ?44 19.6         Pop fossa -  Ankle      R Tibial - AH     Ankle AH 3.9 ?5.8 10.7 ?4.0 100 Ankle - AH 9   29.1     Pop fossa AH 10.1  9.9  92.3 Pop fossa - Ankle 33 53 ?41 26.9  L Tibial - AH     Ankle AH 4.2 ?5.8 8.9 ?4.0 100 Ankle - AH 9   19.0     Pop fossa AH 10.8  7.9  88.4 Pop fossa - Ankle 33 50 ?41 17.0             SNC    Nerve / Sites Rec. Site Peak Lat Ref.  Amp Ref. Segments Distance    ms ms V V  cm  R Sural - Ankle (Calf)     Calf Ankle 3.0 ?4.4 10 ?6 Calf - Ankle 14  L Sural - Ankle (Calf)     Calf Ankle 3.2 ?4.4 8 ?6 Calf - Ankle 14         F  Wave    Nerve F Lat Ref.   ms ms  R Tibial - AH 41.6 ?56.0  L Tibial - AH 40.9 ?56.0         H Reflex    Nerve H Lat  Lat Hmax   ms ms   Left Right Ref. Left Right Ref.  Tibial - [place]us 26.4 27.0 ?35.0 29.4 28.0 ?35.0         EMG full       EMG Summary Table    Spontaneous MUAP Recruitment  Muscle IA Fib PSW Fasc Other Amp Dur. Poly Pattern  R. Vastus medialis Normal None None None _______ Normal Normal Normal Normal  R. Tibialis anterior Normal None None None _______ Normal Normal Normal Normal  R. Gastrocnemius (Medial head) Normal None None None _______ Normal Normal Normal Normal  R. Lumbar paraspinals Normal None 1+ None _______ Normal Normal Normal Normal  L. Lumbar paraspinals Normal None None None _______ Normal Normal Normal Normal

## 2017-09-28 ENCOUNTER — Ambulatory Visit: Payer: 59 | Admitting: Podiatry

## 2017-10-02 ENCOUNTER — Ambulatory Visit: Payer: 59 | Admitting: Podiatry

## 2017-10-23 ENCOUNTER — Ambulatory Visit: Payer: 59 | Admitting: Podiatry

## 2018-01-22 ENCOUNTER — Encounter (HOSPITAL_BASED_OUTPATIENT_CLINIC_OR_DEPARTMENT_OTHER): Payer: Self-pay | Admitting: Emergency Medicine

## 2018-01-22 ENCOUNTER — Other Ambulatory Visit: Payer: Self-pay

## 2018-01-22 ENCOUNTER — Emergency Department (HOSPITAL_BASED_OUTPATIENT_CLINIC_OR_DEPARTMENT_OTHER)
Admission: EM | Admit: 2018-01-22 | Discharge: 2018-01-22 | Disposition: A | Discharge status: Home / Self Care | Payer: Medicaid Other | Attending: Emergency Medicine | Admitting: Emergency Medicine

## 2018-01-22 DIAGNOSIS — Z8541 Personal history of malignant neoplasm of cervix uteri: Secondary | ICD-10-CM | POA: Insufficient documentation

## 2018-01-22 DIAGNOSIS — Z91040 Latex allergy status: Secondary | ICD-10-CM | POA: Insufficient documentation

## 2018-01-22 DIAGNOSIS — M546 Pain in thoracic spine: Secondary | ICD-10-CM | POA: Insufficient documentation

## 2018-01-22 HISTORY — DX: Anxiety disorder, unspecified: F41.9

## 2018-01-22 HISTORY — DX: Calculus of kidney: N20.0

## 2018-01-22 HISTORY — DX: Malignant neoplasm of cervix uteri, unspecified: C53.9

## 2018-01-22 HISTORY — DX: Dorsalgia, unspecified: M54.9

## 2018-01-22 LAB — URINALYSIS, ROUTINE W REFLEX MICROSCOPIC
BILIRUBIN URINE: NEGATIVE
Glucose, UA: NEGATIVE mg/dL
HGB URINE DIPSTICK: NEGATIVE
Ketones, ur: NEGATIVE mg/dL
Leukocytes, UA: NEGATIVE
Nitrite: NEGATIVE
Protein, ur: NEGATIVE mg/dL
Specific Gravity, Urine: <1.005 — ABNORMAL LOW (ref 1.005–1.030)
pH: 6 (ref 5.0–8.0)

## 2018-01-22 NOTE — ED Notes (Signed)
Right sided flank pain for two weeks.  No dysuria.  No fever.  No N/V/D.  Pt states pain is different than her back pain.  Heating pad helps.  Nothing seems to worsen.  Pt believes it to be a kidney stone.

## 2018-01-22 NOTE — ED Triage Notes (Signed)
Right flank pain for two weeks.  History of kidney stone.  Pt states seen recently for same at md office.

## 2018-01-22 NOTE — ED Provider Notes (Signed)
[facility] EMERGENCY DEPARTMENT Provider Note   CSN: [id number] Arrival date & time: 01/22/18  1051     History   Chief Complaint Chief Complaint  Patient presents with  . Flank Pain    HPI Ashley [name] is a 32 y.o. female who presents with complaint of pain in her right mid thoracic region.  The patient states "I think I have a kidney stone.  She states that the pain has been present in her upper back for the past 2 weeks.  She says "I have had a kidney stone and this is what it feels like."  She states that she saw her primary care physician 2 weeks ago.  At that time she also was diagnosed with an ear infection and URI and has been taking amoxicillin.  She states that those symptoms are improving.  She says that regarding her concern for kidney stone her primary care doctor "told me I was full of shit."  She states "all he did was taken x-ray."  She denies pleuritic chest pain, shortness of breath, nausea, vomiting.  She denies frequency, urgency, hematuria.  Patient states that the pain is constant, aching and gripping.  She denies that it is worse with movement.  Is not had any fever or chills.  HPI  Past Medical History:  Diagnosis Date  . Anxiety   . Back pain   . Cervical cancer ([place])   . Depression   . Kidney calculi     Patient Active Problem List   Diagnosis Date Noted  . Low back pain radiating to both legs 09/13/2017  . Viral URI 02/26/2017  . Stress at home 12/16/2016  . S/P tonsillectomy 07/25/2015  . Hematochezia 02/20/2015  . Constipation 02/20/2015  . Recurrent major depressive disorder, in partial remission ([place]) 10/09/2014  . Anxiety, generalized 10/09/2014  . Bulging lumbar disc 10/09/2014  . Myofascial pain syndrome 09/05/2014  . Depression 09/05/2014  . Routine history and physical examination of adult 04/10/2014  . Intractable chronic migraine without aura and without status migrainosus 04/10/2014    Past Surgical History:    Procedure Laterality Date  . ABDOMINAL HYSTERECTOMY    . KIDNEY STONE SURGERY    . RHINOPLASTY    . TONSILLECTOMY       OB History   None      Home Medications    Prior to Admission medications   Medication Sig Start Date End Date Taking? Authorizing Provider  meclizine (ANTIVERT) 25 MG tablet Take by mouth. 01/06/18  Yes [provider]  polyethylene glycol (MIRALAX / GLYCOLAX) packet Take by mouth. 01/06/18  Yes [provider]    Family History Family History  Problem Relation Age of Onset  . Cancer Mother        breast    Social History Social History   Tobacco Use  . Smoking status: Never Smoker  . Smokeless tobacco: Never Used  Substance Use Topics  . Alcohol use: No  . Drug use: No     Allergies   Codeine; Latex; Levofloxacin; Oxycodone; and Penicillins   Review of Systems Review of Systems  Ten systems reviewed and are negative for acute change, except as noted in the HPI.   Physical Exam Updated Vital Signs BP (!) 129/100 (BP Location: Left Arm)   Pulse (!) 58   Temp 98 F (36.7 C) (Oral)   Resp 16   Ht 5\' 2"  (1.575 m)   Wt 90.7 kg (200 lb)  SpO2 100%   BMI 36.58 kg/m   Physical Exam  Constitutional: She is oriented to person, place, and time. She appears well-developed and well-nourished.  Patient is resting comfortably in the bed.  She states that she has 10 out of 10 pain  HENT:  Head: Normocephalic and atraumatic.  Eyes: Pupils are equal, round, and reactive to light. EOM are normal.  Neck: Normal range of motion. Neck supple.  Cardiovascular: Normal rate and regular rhythm.  Pulmonary/Chest: Effort normal and breath sounds normal. She has no rales. She exhibits no tenderness.  Abdominal: Soft. Bowel sounds are normal. There is no tenderness.  No CVA tenderness  Musculoskeletal:       Thoracic back: She exhibits tenderness and spasm.       Back:  Neurological: She is alert and oriented to person, place, and  time.  Skin: Skin is warm and dry. Capillary refill takes less than 2 seconds.  Nursing note and vitals reviewed.    ED Treatments / Results  Labs (all labs ordered are listed, but only abnormal results are displayed) Labs Reviewed  URINALYSIS, ROUTINE W REFLEX MICROSCOPIC - Abnormal; Notable for the following components:      Result Value   Color, Urine STRAW (*)    Specific Gravity, Urine <1.005 (*)    All other components within normal limits    EKG None  Radiology No results found.  Procedures Procedures (including critical care time)  Medications Ordered in ED Medications - No data to display   Initial Impression / Assessment and Plan / ED Course  I have reviewed the triage vital signs and the nursing notes.  Pertinent labs & imaging results that were available during my care of the patient were reviewed by me and considered in my medical decision making (see chart for details).     Patient presents with complaint of flank pain stating that she has a kidney stone.  I discussed with the patient that I had extremely low suspicion for kidney stone given the nature of her pain, the length of time she has had pain.  I also feel this is musculoskeletal because I was able to easily reproduce the pain by palpating the muscular tissue in the region where she is complaining of tenderness.  I discussed the fact that a stone in the kidney would not cause pain unless it had moved out into the ureter.  I also have low suspicion for pain given the fact that her urine is clear without hematuria crystals or white cells.  The patient became extremely angry and states "so you are not going to scan me for a kidney stone?"  I stated that I did not feel that that amount of radiation was necessary because my suspicion was so low however I offered to have the attending physician return to evaluate the patient.  The patient states "I do not want any of that I am out of here."  Tried to reason with  the patient however she began putting her clothes on and he eloped prior to me handing her any discharge paperwork.  Final Clinical Impressions(s) / ED Diagnoses   Final diagnoses:  Trigger point of thoracic region    ED Discharge Orders    None       Margarita Mail, PA-C 01/22/18 1137    Malvin Johns, MD 01/22/18 1142

## 2018-01-22 NOTE — ED Notes (Signed)
Pt left department without speaking to staff.

## 2018-03-08 ENCOUNTER — Ambulatory Visit: Payer: 59 | Admitting: Podiatry

## 2019-02-04 ENCOUNTER — Emergency Department (HOSPITAL_COMMUNITY)
Admission: EM | Admit: 2019-02-04 | Discharge: 2019-02-04 | Disposition: A | Discharge status: Home / Self Care | Payer: Medicaid Other | Attending: Emergency Medicine | Admitting: Emergency Medicine

## 2019-02-04 ENCOUNTER — Emergency Department (HOSPITAL_COMMUNITY): Payer: Medicaid Other

## 2019-02-04 DIAGNOSIS — Y999 Unspecified external cause status: Secondary | ICD-10-CM | POA: Insufficient documentation

## 2019-02-04 DIAGNOSIS — S0003XA Contusion of scalp, initial encounter: Secondary | ICD-10-CM | POA: Diagnosis not present

## 2019-02-04 DIAGNOSIS — S0990XA Unspecified injury of head, initial encounter: Secondary | ICD-10-CM | POA: Diagnosis present

## 2019-02-04 DIAGNOSIS — S139XXA Sprain of joints and ligaments of unspecified parts of neck, initial encounter: Secondary | ICD-10-CM | POA: Insufficient documentation

## 2019-02-04 DIAGNOSIS — S8012XA Contusion of left lower leg, initial encounter: Secondary | ICD-10-CM | POA: Diagnosis not present

## 2019-02-04 DIAGNOSIS — S161XXA Strain of muscle, fascia and tendon at neck level, initial encounter: Secondary | ICD-10-CM

## 2019-02-04 DIAGNOSIS — S2020XA Contusion of thorax, unspecified, initial encounter: Secondary | ICD-10-CM | POA: Insufficient documentation

## 2019-02-04 DIAGNOSIS — Y92410 Unspecified street and highway as the place of occurrence of the external cause: Secondary | ICD-10-CM | POA: Insufficient documentation

## 2019-02-04 DIAGNOSIS — Y93I9 Activity, other involving external motion: Secondary | ICD-10-CM | POA: Diagnosis not present

## 2019-02-04 DIAGNOSIS — S301XXA Contusion of abdominal wall, initial encounter: Secondary | ICD-10-CM | POA: Diagnosis not present

## 2019-02-04 DIAGNOSIS — S20219A Contusion of unspecified front wall of thorax, initial encounter: Secondary | ICD-10-CM

## 2019-02-04 LAB — ETHANOL: Alcohol, Ethyl (B): <10 mg/dL (ref <10)

## 2019-02-04 LAB — CBC WITH DIFFERENTIAL/PLATELET
Abs Immature Granulocytes: 0.04 10*3/uL (ref 0.00–0.07)
Basophils Absolute: 0 10*3/uL (ref 0.0–0.1)
Basophils Relative: 1 %
Eosinophils Absolute: 0.1 10*3/uL (ref 0.0–0.5)
Eosinophils Relative: 2 %
HCT: 41.9 % (ref 36.0–46.0)
Hemoglobin: 13.9 g/dL (ref 12.0–15.0)
Immature Granulocytes: 1 %
Lymphocytes Relative: 25 %
Lymphs Abs: 1.5 10*3/uL (ref 0.7–4.0)
MCH: 30 pg (ref 26.0–34.0)
MCHC: 33.2 g/dL (ref 30.0–36.0)
MCV: 90.3 fL (ref 80.0–100.0)
Monocytes Absolute: 0.5 10*3/uL (ref 0.1–1.0)
Monocytes Relative: 7 %
Neutro Abs: 4 10*3/uL (ref 1.7–7.7)
Neutrophils Relative %: 64 %
Platelets: 199 10*3/uL (ref 150–400)
RBC: 4.64 MIL/uL (ref 3.87–5.11)
RDW: 11.9 % (ref 11.5–15.5)
WBC: 6.1 10*3/uL (ref 4.0–10.5)
nRBC: 0 % (ref 0.0–0.2)

## 2019-02-04 LAB — BASIC METABOLIC PANEL
Anion gap: 11 (ref 5–15)
BUN: 12 mg/dL (ref 6–20)
CO2: 23 mmol/L (ref 22–32)
Calcium: 9.6 mg/dL (ref 8.9–10.3)
Chloride: 106 mmol/L (ref 98–111)
Creatinine, Ser: 1.13 mg/dL — ABNORMAL HIGH (ref 0.44–1.00)
GFR calc Af Amer: >60 mL/min (ref >60)
GFR calc non Af Amer: >60 mL/min (ref >60)
Glucose, Bld: 91 mg/dL (ref 70–99)
Potassium: 3.9 mmol/L (ref 3.5–5.1)
Sodium: 140 mmol/L (ref 135–145)

## 2019-02-04 LAB — PROTIME-INR
INR: 1 (ref 0.8–1.2)
Prothrombin Time: 13.4 seconds (ref 11.4–15.2)

## 2019-02-04 LAB — I-STAT BETA HCG BLOOD, ED (MC, WL, AP ONLY): I-stat hCG, quantitative: <5 m[IU]/mL (ref <5)

## 2019-02-04 MED ORDER — SODIUM CHLORIDE 0.9 % IV BOLUS
1000.0000 mL | Freq: Once | INTRAVENOUS | Status: AC
  Administered 2019-02-04: 1000 mL via INTRAVENOUS

## 2019-02-04 MED ORDER — CYCLOBENZAPRINE HCL 10 MG PO TABS: 10.0000 mg | ORAL_TABLET | Freq: Three times a day (TID) | ORAL | 0 refills | Status: AC | PRN

## 2019-02-04 MED ORDER — ONDANSETRON HCL 4 MG/2ML IJ SOLN
4.0000 mg | Freq: Once | INTRAMUSCULAR | Status: AC
  Administered 2019-02-04: 4 mg via INTRAVENOUS
  Filled 2019-02-04: qty 2

## 2019-02-04 MED ORDER — IBUPROFEN 800 MG PO TABS: 800.0000 mg | ORAL_TABLET | Freq: Three times a day (TID) | ORAL | 0 refills | Status: AC

## 2019-02-04 MED ORDER — MORPHINE SULFATE (PF) 4 MG/ML IV SOLN
4.0000 mg | Freq: Once | INTRAVENOUS | Status: AC
  Administered 2019-02-04: 4 mg via INTRAVENOUS
  Filled 2019-02-04: qty 1

## 2019-02-04 MED ORDER — PROMETHAZINE HCL 25 MG/ML IJ SOLN
12.5000 mg | Freq: Once | INTRAMUSCULAR | Status: AC
  Administered 2019-02-04: 12.5 mg via INTRAVENOUS
  Filled 2019-02-04: qty 1

## 2019-02-04 MED ORDER — ONDANSETRON 8 MG PO TBDP: ORAL_TABLET | ORAL | 0 refills | Status: DC

## 2019-02-04 MED ORDER — IOHEXOL 300 MG/ML  SOLN
100.0000 mL | Freq: Once | INTRAMUSCULAR | Status: AC | PRN
  Administered 2019-02-04: 22:00:00 100 mL via INTRAVENOUS
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
## 2019-02-04 NOTE — ED Notes (Signed)
Patient verbalized understanding of discharge instructions. Opportunity for questions were provided. Pt. ambulatory and discharged home.  

## 2019-02-04 NOTE — Discharge Instructions (Addendum)
Ibuprofen as prescribed.  Flexeril as prescribed as needed for pain not relieved with ibuprofen.  Follow-up with primary doctor if symptoms not improving in the next few days, and return to the ER if you develop worsening headache, difficulty breathing, chest pain, or other new and concerning symptoms.

## 2019-02-07 ENCOUNTER — Encounter (HOSPITAL_BASED_OUTPATIENT_CLINIC_OR_DEPARTMENT_OTHER): Payer: Self-pay | Admitting: *Deleted

## 2019-02-07 ENCOUNTER — Emergency Department (HOSPITAL_BASED_OUTPATIENT_CLINIC_OR_DEPARTMENT_OTHER)
Admission: EM | Admit: 2019-02-07 | Discharge: 2019-02-08 | Disposition: A | Discharge status: Home / Self Care | Payer: Medicaid Other | Attending: Emergency Medicine | Admitting: Emergency Medicine

## 2019-02-07 ENCOUNTER — Other Ambulatory Visit: Payer: Self-pay

## 2019-02-07 ENCOUNTER — Emergency Department (HOSPITAL_COMMUNITY): Payer: Medicaid Other

## 2019-02-07 ENCOUNTER — Emergency Department (HOSPITAL_BASED_OUTPATIENT_CLINIC_OR_DEPARTMENT_OTHER): Payer: Medicaid Other

## 2019-02-07 DIAGNOSIS — R200 Anesthesia of skin: Secondary | ICD-10-CM | POA: Diagnosis present

## 2019-02-07 DIAGNOSIS — T148XXA Other injury of unspecified body region, initial encounter: Secondary | ICD-10-CM

## 2019-02-07 DIAGNOSIS — Z79899 Other long term (current) drug therapy: Secondary | ICD-10-CM | POA: Diagnosis not present

## 2019-02-07 DIAGNOSIS — Z91040 Latex allergy status: Secondary | ICD-10-CM | POA: Diagnosis not present

## 2019-02-07 DIAGNOSIS — M7918 Myalgia, other site: Secondary | ICD-10-CM | POA: Diagnosis not present

## 2019-02-07 DIAGNOSIS — R202 Paresthesia of skin: Secondary | ICD-10-CM | POA: Insufficient documentation

## 2019-02-07 LAB — CBC WITH DIFFERENTIAL/PLATELET
Abs Immature Granulocytes: 0.02 10*3/uL (ref 0.00–0.07)
Basophils Absolute: 0 10*3/uL (ref 0.0–0.1)
Basophils Relative: 1 %
Eosinophils Absolute: 0.1 10*3/uL (ref 0.0–0.5)
Eosinophils Relative: 2 %
HCT: 37.2 % (ref 36.0–46.0)
Hemoglobin: 12 g/dL (ref 12.0–15.0)
Immature Granulocytes: 0 %
Lymphocytes Relative: 19 %
Lymphs Abs: 1 10*3/uL (ref 0.7–4.0)
MCH: 29.8 pg (ref 26.0–34.0)
MCHC: 32.3 g/dL (ref 30.0–36.0)
MCV: 92.3 fL (ref 80.0–100.0)
Monocytes Absolute: 0.3 10*3/uL (ref 0.1–1.0)
Monocytes Relative: 6 %
Neutro Abs: 3.9 10*3/uL (ref 1.7–7.7)
Neutrophils Relative %: 72 %
Platelets: 175 10*3/uL (ref 150–400)
RBC: 4.03 MIL/uL (ref 3.87–5.11)
RDW: 11.9 % (ref 11.5–15.5)
WBC: 5.4 10*3/uL (ref 4.0–10.5)
nRBC: 0 % (ref 0.0–0.2)

## 2019-02-07 LAB — COMPREHENSIVE METABOLIC PANEL
ALT: 52 U/L — ABNORMAL HIGH (ref 0–44)
AST: 32 U/L (ref 15–41)
Albumin: 3.7 g/dL (ref 3.5–5.0)
Alkaline Phosphatase: 62 U/L (ref 38–126)
Anion gap: 5 (ref 5–15)
BUN: 11 mg/dL (ref 6–20)
CO2: 26 mmol/L (ref 22–32)
Calcium: 9 mg/dL (ref 8.9–10.3)
Chloride: 108 mmol/L (ref 98–111)
Creatinine, Ser: 0.84 mg/dL (ref 0.44–1.00)
GFR calc Af Amer: >60 mL/min (ref >60)
GFR calc non Af Amer: >60 mL/min (ref >60)
Glucose, Bld: 97 mg/dL (ref 70–99)
Potassium: 4 mmol/L (ref 3.5–5.1)
Sodium: 139 mmol/L (ref 135–145)
Total Bilirubin: 0.6 mg/dL (ref 0.3–1.2)
Total Protein: 6.3 g/dL — ABNORMAL LOW (ref 6.5–8.1)

## 2019-02-07 MED ORDER — SODIUM CHLORIDE 0.9 % IV BOLUS
500.0000 mL | Freq: Once | INTRAVENOUS | Status: AC
  Administered 2019-02-08: 500 mL via INTRAVENOUS

## 2019-02-07 MED ORDER — ONDANSETRON HCL 4 MG/2ML IJ SOLN
4.0000 mg | Freq: Once | INTRAMUSCULAR | Status: AC
  Administered 2019-02-07: 22:00:00 4 mg via INTRAVENOUS
  Filled 2019-02-07: qty 2

## 2019-02-07 MED ORDER — HYDROCODONE-ACETAMINOPHEN 5-325 MG PO TABS
1.0000 | ORAL_TABLET | Freq: Once | ORAL | Status: AC
  Administered 2019-02-07: 17:00:00 1 via ORAL
  Filled 2019-02-07: qty 1

## 2019-02-07 MED ORDER — ONDANSETRON 4 MG PO TBDP
4.0000 mg | ORAL_TABLET | Freq: Once | ORAL | Status: AC
  Administered 2019-02-07: 4 mg via ORAL
  Filled 2019-02-07: qty 1

## 2019-02-07 MED ORDER — MORPHINE SULFATE (PF) 4 MG/ML IV SOLN
4.0000 mg | Freq: Once | INTRAVENOUS | Status: AC
  Administered 2019-02-07: 4 mg via INTRAVENOUS
  Filled 2019-02-07: qty 1

## 2019-02-07 NOTE — Discharge Instructions (Addendum)
You may use over-the-counter Motrin (Ibuprofen), Acetaminophen (Tylenol), topical muscle creams such as SalonPas, Icy Hot, Bengay, etc. Please stretch, apply heat, and have massage therapy for additional assistance. ° °
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
## 2019-02-07 NOTE — ED Notes (Signed)
Pt for transfer to [facility] ER for MRI by private vehicle.  IV remains in place to right hand, approved by MD

## 2019-02-08 MED ORDER — KETOROLAC TROMETHAMINE 15 MG/ML IJ SOLN
15.0000 mg | Freq: Once | INTRAMUSCULAR | Status: AC
  Administered 2019-02-08: 15 mg via INTRAVENOUS
  Filled 2019-02-08: qty 1
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
## 2019-02-14 ENCOUNTER — Ambulatory Visit: Payer: 59 | Admitting: Podiatry
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
## 2021-03-31 ENCOUNTER — Emergency Department (HOSPITAL_BASED_OUTPATIENT_CLINIC_OR_DEPARTMENT_OTHER): Payer: Medicaid Other

## 2021-03-31 ENCOUNTER — Emergency Department (HOSPITAL_BASED_OUTPATIENT_CLINIC_OR_DEPARTMENT_OTHER)
Admission: EM | Admit: 2021-03-31 | Discharge: 2021-03-31 | Disposition: A | Discharge status: Home / Self Care | Payer: Medicaid Other | Attending: Emergency Medicine | Admitting: Emergency Medicine

## 2021-03-31 ENCOUNTER — Encounter (HOSPITAL_BASED_OUTPATIENT_CLINIC_OR_DEPARTMENT_OTHER): Payer: Self-pay | Admitting: *Deleted

## 2021-03-31 ENCOUNTER — Other Ambulatory Visit: Payer: Self-pay

## 2021-03-31 DIAGNOSIS — W208XXA Other cause of strike by thrown, projected or falling object, initial encounter: Secondary | ICD-10-CM | POA: Insufficient documentation

## 2021-03-31 DIAGNOSIS — Z91040 Latex allergy status: Secondary | ICD-10-CM | POA: Insufficient documentation

## 2021-03-31 DIAGNOSIS — S92422A Displaced fracture of distal phalanx of left great toe, initial encounter for closed fracture: Secondary | ICD-10-CM

## 2021-03-31 DIAGNOSIS — Z8541 Personal history of malignant neoplasm of cervix uteri: Secondary | ICD-10-CM | POA: Insufficient documentation

## 2021-03-31 DIAGNOSIS — S99922A Unspecified injury of left foot, initial encounter: Secondary | ICD-10-CM | POA: Diagnosis present

## 2021-03-31 MED ORDER — IBUPROFEN 800 MG PO TABS
800.0000 mg | ORAL_TABLET | Freq: Once | ORAL | Status: AC
  Administered 2021-03-31: 800 mg via ORAL
  Filled 2021-03-31: qty 1

## 2021-03-31 NOTE — ED Triage Notes (Signed)
Pt reports she dropped a railroad tie on her left foot this afternoon. She took ibuprofen. States too painful to walk

## 2021-03-31 NOTE — ED Provider Notes (Signed)
[facility] EMERGENCY DEPARTMENT Provider Note   CSN: [id number] Arrival date & time: 03/31/21  2130     History Chief Complaint  Patient presents with   Foot Pain    Ashley [name] is a 35 y.o. female.  Patient presents to the emergency department for evaluation of left foot injury.  Patient reports that she accidentally dropped a railroad tie on her left foot today.  Patient reports pain of the great toe but she also has some pain at the base of the toe and distal portion of the foot.  Patient reports that it is too painful to walk.      Past Medical History:  Diagnosis Date   Anxiety    Back pain    Cervical cancer [facility])    Depression    Kidney calculi     Patient Active Problem List   Diagnosis Date Noted   Low back pain radiating to both legs 09/13/2017   Viral URI 02/26/2017   Stress at home 12/16/2016   S/P tonsillectomy 07/25/2015   Hematochezia 02/20/2015   Constipation 02/20/2015   Recurrent major depressive disorder, in partial remission ([place]) 10/09/2014   Anxiety, generalized 10/09/2014   Bulging lumbar disc 10/09/2014   Myofascial pain syndrome 09/05/2014   Depression 09/05/2014   Routine history and physical examination of adult 04/10/2014   Intractable chronic migraine without aura and without status migrainosus 04/10/2014    Past Surgical History:  Procedure Laterality Date   ABDOMINAL HYSTERECTOMY     KIDNEY STONE SURGERY     RHINOPLASTY     TONSILLECTOMY       OB History   No obstetric history on file.     Family History  Problem Relation Age of Onset   Cancer Mother        breast    Social History   Tobacco Use   Smoking status: Never   Smokeless tobacco: Never  Vaping Use   Vaping Use: Never used  Substance Use Topics   Alcohol use: Yes    Comment: occasional   Drug use: No    Home Medications Prior to Admission medications   Medication Sig Start Date End Date Taking? Authorizing Provider  cyclobenzaprine  (FLEXERIL) 10 MG tablet Take 1 tablet (10 mg total) by mouth 3 (three) times daily as needed for muscle spasms. 02/04/19   Veryl Speak, MD  ibuprofen (ADVIL) 800 MG tablet Take 1 tablet (800 mg total) by mouth 3 (three) times daily. 02/04/19   Veryl Speak, MD  ondansetron (ZOFRAN) 8 MG tablet Take 8 mg by mouth every 4 (four) hours as needed for nausea or vomiting.    [provider]    Allergies    Codeine, Latex, Levofloxacin, Oxycodone, and Penicillins  Review of Systems   Review of Systems  Musculoskeletal:  Positive for arthralgias.  Skin:  Positive for color change.   Physical Exam Updated Vital Signs BP 112/86 (BP Location: Right Arm)   Pulse 98   Temp 98.8 F (37.1 C) (Oral)   Resp 18   Ht 5\' 2"  (1.575 m)   Wt 81.2 kg   SpO2 99%   BMI 32.74 kg/m   Physical Exam Vitals and nursing note reviewed.  HENT:     Head: Normocephalic and atraumatic.  Musculoskeletal:     Left foot: Decreased range of motion (Great toe). Normal capillary refill. Swelling (Great toe) and tenderness (Great toe) present. No laceration.  Skin:    Findings: Bruising (Left  great toe and distal foot) present.  Neurological:     Mental Status: She is alert and oriented to person, place, and time.     Sensory: Sensation is intact.     Motor: Motor function is intact.    ED Results / Procedures / Treatments   Labs (all labs ordered are listed, but only abnormal results are displayed) Labs Reviewed - No data to display  EKG None  Radiology DG Foot Complete Left  Result Date: 03/31/2021 CLINICAL DATA:  Dropped heavy object on foot. EXAM: LEFT FOOT - COMPLETE 3+ VIEW COMPARISON:  None. FINDINGS: There is a fracture through the distal phalanx of the left great toe. Minimal displacement. No subluxation or dislocation. Joint spaces maintained. IMPRESSION: Minimally displaced fracture through the left great toe distal phalanx. Electronically Signed   By: Rolm Baptise M.D.   On: 03/31/2021  22:55    Procedures Procedures   Medications Ordered in ED Medications  ibuprofen (ADVIL) tablet 800 mg (has no administration in time range)    ED Course  I have reviewed the triage vital signs and the nursing notes.  Pertinent labs & imaging results that were available during my care of the patient were reviewed by me and considered in my medical decision making (see chart for details).    MDM Rules/Calculators/A&P                           Nondisplaced fracture of the distal phalanx of great toe.  CAM Walker for comfort.  OTC analgesia.  No specific follow-up necessary.  Final Clinical Impression(s) / ED Diagnoses Final diagnoses:  Closed displaced fracture of distal phalanx of left great toe, initial encounter    Rx / DC Orders ED Discharge Orders     None        Orpah Greek, MD 03/31/21 2318

## 2021-11-21 ENCOUNTER — Ambulatory Visit (HOSPITAL_COMMUNITY): Payer: No Typology Code available for payment source | Admitting: Physical Therapy

## 2023-08-09 ENCOUNTER — Encounter (HOSPITAL_BASED_OUTPATIENT_CLINIC_OR_DEPARTMENT_OTHER): Payer: Self-pay | Admitting: Urology

## 2023-08-09 ENCOUNTER — Emergency Department (HOSPITAL_BASED_OUTPATIENT_CLINIC_OR_DEPARTMENT_OTHER): Payer: Self-pay

## 2023-08-09 DIAGNOSIS — R109 Unspecified abdominal pain: Secondary | ICD-10-CM | POA: Diagnosis present

## 2023-08-09 DIAGNOSIS — Z79899 Other long term (current) drug therapy: Secondary | ICD-10-CM | POA: Insufficient documentation

## 2023-08-09 DIAGNOSIS — Z91040 Latex allergy status: Secondary | ICD-10-CM | POA: Diagnosis not present

## 2023-08-09 LAB — BASIC METABOLIC PANEL
Anion gap: 8 (ref 5–15)
BUN: 13 mg/dL (ref 6–20)
CO2: 25 mmol/L (ref 22–32)
Calcium: 9.2 mg/dL (ref 8.9–10.3)
Chloride: 108 mmol/L (ref 98–111)
Creatinine, Ser: 0.97 mg/dL (ref 0.44–1.00)
GFR, Estimated: >60 mL/min (ref >60)
Glucose, Bld: 102 mg/dL — ABNORMAL HIGH (ref 70–99)
Potassium: 4.3 mmol/L (ref 3.5–5.1)
Sodium: 141 mmol/L (ref 135–145)

## 2023-08-09 LAB — URINALYSIS, ROUTINE W REFLEX MICROSCOPIC
Bilirubin Urine: NEGATIVE
Glucose, UA: NEGATIVE mg/dL
Ketones, ur: NEGATIVE mg/dL
Leukocytes,Ua: NEGATIVE
Nitrite: NEGATIVE
Protein, ur: 30 mg/dL — AB
Specific Gravity, Urine: >=1.03 (ref 1.005–1.030)
pH: 5.5 (ref 5.0–8.0)

## 2023-08-09 LAB — URINALYSIS, MICROSCOPIC (REFLEX): RBC / HPF: >50 RBC/hpf (ref 0–5)

## 2023-08-09 LAB — CBC
HCT: 40.9 % (ref 36.0–46.0)
Hemoglobin: 13.5 g/dL (ref 12.0–15.0)
MCH: 29.8 pg (ref 26.0–34.0)
MCHC: 33 g/dL (ref 30.0–36.0)
MCV: 90.3 fL (ref 80.0–100.0)
Platelets: 261 10*3/uL (ref 150–400)
RBC: 4.53 MIL/uL (ref 3.87–5.11)
RDW: 12.1 % (ref 11.5–15.5)
WBC: 8.9 10*3/uL (ref 4.0–10.5)
nRBC: 0 % (ref 0.0–0.2)

## 2023-08-09 MED ORDER — KETOROLAC TROMETHAMINE 15 MG/ML IJ SOLN
15.0000 mg | Freq: Once | INTRAMUSCULAR | Status: AC
  Administered 2023-08-09: 15 mg via INTRAVENOUS
  Filled 2023-08-09: qty 1

## 2023-08-09 NOTE — ED Triage Notes (Signed)
 Pt states right side flank pain that started this am  Thought it was UTI took clindymycin  Reports some nausea   Took tramadol 1 hr pta

## 2023-08-09 NOTE — ED Notes (Signed)
 Patient transported to CT

## 2023-08-10 ENCOUNTER — Emergency Department (HOSPITAL_BASED_OUTPATIENT_CLINIC_OR_DEPARTMENT_OTHER)
Admission: EM | Admit: 2023-08-10 | Discharge: 2023-08-10 | Disposition: A | Discharge status: Home / Self Care | Payer: Self-pay | Attending: Emergency Medicine | Admitting: Emergency Medicine

## 2023-08-10 DIAGNOSIS — R109 Unspecified abdominal pain: Secondary | ICD-10-CM

## 2023-08-10 MED ORDER — SODIUM CHLORIDE 0.9 % IV SOLN
INTRAVENOUS | Status: DC | PRN
  Administered 2023-08-10: 250 mL via INTRAVENOUS

## 2023-08-10 MED ORDER — CEPHALEXIN 500 MG PO CAPS: 500.0000 mg | ORAL_CAPSULE | Freq: Four times a day (QID) | ORAL | 0 refills | Status: AC

## 2023-08-10 MED ORDER — SODIUM CHLORIDE 0.9 % IV SOLN
2.0000 g | Freq: Once | INTRAVENOUS | Status: AC
  Administered 2023-08-10: 2 g via INTRAVENOUS
  Filled 2023-08-10: qty 20

## 2023-08-10 MED ORDER — HYDROCODONE-ACETAMINOPHEN 5-325 MG PO TABS: 1.0000 | ORAL_TABLET | Freq: Four times a day (QID) | ORAL | 0 refills | Status: AC | PRN

## 2023-08-10 MED ORDER — ONDANSETRON 4 MG PO TBDP
8.0000 mg | ORAL_TABLET | Freq: Once | ORAL | Status: AC
  Administered 2023-08-10: 8 mg via ORAL
  Filled 2023-08-10: qty 2

## 2023-08-10 MED ORDER — HYDROCODONE-ACETAMINOPHEN 5-325 MG PO TABS
1.0000 | ORAL_TABLET | Freq: Once | ORAL | Status: AC
  Administered 2023-08-10: 1 via ORAL
  Filled 2023-08-10: qty 1

## 2023-08-10 MED ORDER — ONDANSETRON 4 MG PO TBDP: ORAL_TABLET | ORAL | 0 refills | Status: AC

## 2023-08-10 NOTE — ED Provider Notes (Signed)
 [employer] AT [facility] Provider Note   CSN: [id number] Arrival date & time: 08/09/23  1951     History  Chief Complaint  Patient presents with   Flank Pain    Ashley [name] is a 38 y.o. female.  Right sided flank pain throughout the day today. Some urinary frequency, urgency and dysuria as well. H/o kidney sontes, no h/o pyelo. No fevers. Some nausea. No trauma. No diarrhea or constipation.    Flank Pain       Home Medications Prior to Admission medications   Medication Sig Start Date End Date Taking? Authorizing Provider  cephALEXin (KEFLEX) 500 MG capsule Take 1 capsule (500 mg total) by mouth 4 (four) times daily. 08/10/23  Yes Carinne Brandenburger, Barbara Cower, MD  HYDROcodone-acetaminophen (NORCO/VICODIN) 5-325 MG tablet Take 1 tablet by mouth every 6 (six) hours as needed for severe pain (pain score 7-10). 08/10/23  Yes Sayvon Arterberry, Barbara Cower, MD  ondansetron (ZOFRAN-ODT) 4 MG disintegrating tablet 4mg  ODT q4 hours prn nausea/vomit 08/10/23  Yes Chenae Brager, Barbara Cower, MD  cyclobenzaprine (FLEXERIL) 10 MG tablet Take 1 tablet (10 mg total) by mouth 3 (three) times daily as needed for muscle spasms. 02/04/19   Geoffery Lyons, MD  ibuprofen (ADVIL) 800 MG tablet Take 1 tablet (800 mg total) by mouth 3 (three) times daily. 02/04/19   Geoffery Lyons, MD  ondansetron (ZOFRAN) 8 MG tablet Take 8 mg by mouth every 4 (four) hours as needed for nausea or vomiting.    [provider]      Allergies    Codeine, Latex, Levofloxacin, Oxycodone, and Penicillins    Review of Systems   Review of Systems  Genitourinary:  Positive for flank pain.    Physical Exam Updated Vital Signs BP 130/88 (BP Location: Right Arm)   Pulse (!) 56   Temp 97.7 F (36.5 C) (Oral)   Resp 20   Ht 5\' 2"  (1.575 m)   Wt 81.2 kg   SpO2 100%   BMI 32.74 kg/m  Physical Exam Vitals and nursing note reviewed.  Constitutional:      Appearance: She is well-developed.  HENT:     Head:  Normocephalic and atraumatic.     Nose: No congestion or rhinorrhea.  Cardiovascular:     Rate and Rhythm: Normal rate and regular rhythm.  Pulmonary:     Effort: No respiratory distress.     Breath sounds: No stridor.  Abdominal:     General: There is no distension.  Musculoskeletal:     Cervical back: Normal range of motion.  Skin:    General: Skin is warm and dry.  Neurological:     Mental Status: She is alert.     ED Results / Procedures / Treatments   Labs (all labs ordered are listed, but only abnormal results are displayed) Labs Reviewed  URINALYSIS, ROUTINE W REFLEX MICROSCOPIC - Abnormal; Notable for the following components:      Result Value   APPearance CLOUDY (*)    Hgb urine dipstick LARGE (*)    Protein, ur 30 (*)    All other components within normal limits  BASIC METABOLIC PANEL - Abnormal; Notable for the following components:   Glucose, Bld 102 (*)    All other components within normal limits  URINALYSIS, MICROSCOPIC (REFLEX) - Abnormal; Notable for the following components:   Bacteria, UA FEW (*)    All other components within normal limits  URINE CULTURE  CBC    EKG None  Radiology CT Renal Stone Study Result Date: 08/09/2023 CLINICAL DATA:  Right flank pain EXAM: CT ABDOMEN AND PELVIS WITHOUT CONTRAST TECHNIQUE: Multidetector CT imaging of the abdomen and pelvis was performed following the standard protocol without IV contrast. RADIATION DOSE REDUCTION: This exam was performed according to the departmental dose-optimization program which includes automated exposure control, adjustment of the mA and/or kV according to patient size and/or use of iterative reconstruction technique. COMPARISON:  02/04/2019 FINDINGS: Lower chest: No acute abnormality Hepatobiliary: No focal hepatic abnormality. Gallbladder unremarkable. Pancreas: No focal abnormality or ductal dilatation. Spleen: No focal abnormality.  Normal size. Adrenals/Urinary Tract: 3 mm  nonobstructing stone in the lower pole of the right kidney. No ureteral stones or hydronephrosis. Adrenal glands unremarkable. 4 mm stone layering dependently in the bladder. Stomach/Bowel: Normal appendix. Stomach, large and small bowel grossly unremarkable. Vascular/Lymphatic: No evidence of aneurysm or adenopathy. Reproductive: Prior hysterectomy.  No adnexal masses. Other: No free fluid or free air. Musculoskeletal: No acute bony abnormality. IMPRESSION: Right lower pole nephrolithiasis. No ureteral stones or hydronephrosis. 4 mm stone layering dependently within the urinary bladder. No acute findings. Electronically Signed   By: Charlett Nose M.D.   On: 08/09/2023 21:21    Procedures Procedures    Medications Ordered in ED Medications  0.9 %  sodium chloride infusion (0 mLs Intravenous Stopped 08/10/23 0247)  ketorolac (TORADOL) 15 MG/ML injection 15 mg (15 mg Intravenous Given 08/09/23 2056)  HYDROcodone-acetaminophen (NORCO/VICODIN) 5-325 MG per tablet 1 tablet (1 tablet Oral Given 08/10/23 0052)  ondansetron (ZOFRAN-ODT) disintegrating tablet 8 mg (8 mg Oral Given 08/10/23 0052)  cefTRIAXone (ROCEPHIN) 2 g in sodium chloride 0.9 % 100 mL IVPB (0 g Intravenous Stopped 08/10/23 0247)    ED Course/ Medical Decision Making/ A&P                                 Medical Decision Making Amount and/or Complexity of Data Reviewed Labs: ordered.  Risk Prescription drug management.   Possible UTI but there is a stone in her bladder and on my interpretation of the ct her ureter appears a bit dilated on that side suggesting recent passage of a stone. Treated symptomatically. Appears well. Feels better. Stable for d/c.   Final Clinical Impression(s) / ED Diagnoses Final diagnoses:  Flank pain    Rx / DC Orders ED Discharge Orders          Ordered    HYDROcodone-acetaminophen (NORCO/VICODIN) 5-325 MG tablet  Every 6 hours PRN        08/10/23 0210    cephALEXin (KEFLEX) 500 MG capsule  4  times daily        08/10/23 0210    ondansetron (ZOFRAN-ODT) 4 MG disintegrating tablet        08/10/23 0211              Tashon Capp, Barbara Cower, MD 08/10/23 1610

## 2023-08-10 NOTE — ED Notes (Signed)
 Lab called. Lab to add Urine culture to previous specimen

## 2023-08-11 LAB — URINE CULTURE

## 2023-08-16 NOTE — Progress Notes (Deleted)
    Chief Complaint: No chief complaint on file.   History of Present Illness:  Patient did not show for appointment   Past Medical History:  Past Medical History:  Diagnosis Date   Anxiety    Back pain    Cervical cancer (HCC)    Depression    Kidney calculi     Past Surgical History:  Past Surgical History:  Procedure Laterality Date   ABDOMINAL HYSTERECTOMY     KIDNEY STONE SURGERY     RHINOPLASTY     TONSILLECTOMY      Allergies:  Allergies  Allergen Reactions   Codeine Anaphylaxis   Latex Rash   Levofloxacin Hives and Nausea And Vomiting   Oxycodone Anaphylaxis    States she has had vicodin without adverse reaction.   Penicillins Hives    Did it involve swelling of the face/tongue/throat, SOB, or low BP? Unknown Did it involve sudden or severe rash/hives, skin peeling, or any reaction on the inside of your mouth or nose? Yes Did you need to seek medical attention at a hospital or doctor's office? Unknown  When did it last happen?   Pt was a child    If all above answers are "NO", may proceed with cephalosporin use.     Family History:  Family History  Problem Relation Age of Onset   Cancer Mother        breast    Social History:  Social History   Tobacco Use   Smoking status: Never   Smokeless tobacco: Never  Vaping Use   Vaping status: Never Used  Substance Use Topics   Alcohol use: Yes    Comment: occasional   Drug use: No    Review of symptoms:  Constitutional:  Negative for unexplained weight loss, night sweats, fever, chills ENT:  Negative for nose bleeds, sinus pain, painful swallowing CV:  Negative for chest pain, shortness of breath, exercise intolerance, palpitations, loss of consciousness Resp:  Negative for cough, wheezing, shortness of breath GI:  Negative for nausea, vomiting, diarrhea, bloody stools GU:  Positives noted in HPI; otherwise negative for gross hematuria, dysuria, urinary incontinence Neuro:  Negative for  seizures, poor balance, limb weakness, slurred speech Psych:  Negative for lack of energy, depression, anxiety Endocrine:  Negative for polydipsia, polyuria, symptoms of hypoglycemia (dizziness, hunger, sweating) Hematologic:  Negative for anemia, purpura, petechia, prolonged or excessive bleeding, use of anticoagulants  Allergic:  Negative for difficulty breathing or choking as a result of exposure to anything; no shellfish allergy; no allergic response (rash/itch) to materials, foods  Physical exam: There were no vitals taken for this visit. GENERAL APPEARANCE:  Well appearing, well developed, well nourished, NAD HEENT: Atraumatic, Normocephalic. NECK: Normal appearance LUNGS: Normal inspiratory and expiratory excursion HEART: Regular Rate ABDOMEN: *** EXTREMITIES: Moves all extremities well.  Without clubbing, cyanosis, or edema. NEUROLOGIC:  Alert and oriented x 3, normal gait, CN II-XII grossly intact.  MENTAL STATUS:  Appropriate. SKIN:  Warm, dry and intact.    Results: No results found for this or any previous visit (from the past 24 hours).  I have reviewed prior patient's records  I have reviewed referring/prior physicians records  I have reviewed urinalysis  I have reviewed prior urine cultures  I reviewed prior imaging studies  Assessment: No diagnosis found.   Plan: ***

## 2023-08-17 ENCOUNTER — Ambulatory Visit: Payer: Self-pay | Admitting: Urology

## 2023-08-17 DIAGNOSIS — N200 Calculus of kidney: Secondary | ICD-10-CM

## 2023-08-24 NOTE — Progress Notes (Signed)
 Pt did not show for appt

## 2024-06-08 ENCOUNTER — Other Ambulatory Visit: Payer: Self-pay

## 2024-06-08 ENCOUNTER — Emergency Department (HOSPITAL_BASED_OUTPATIENT_CLINIC_OR_DEPARTMENT_OTHER)

## 2024-06-08 ENCOUNTER — Encounter (HOSPITAL_BASED_OUTPATIENT_CLINIC_OR_DEPARTMENT_OTHER): Payer: Self-pay | Admitting: Emergency Medicine

## 2024-06-08 ENCOUNTER — Emergency Department (HOSPITAL_BASED_OUTPATIENT_CLINIC_OR_DEPARTMENT_OTHER)
Admission: EM | Admit: 2024-06-08 | Discharge: 2024-06-08 | Discharge status: Against Medical Advice | Attending: Emergency Medicine | Admitting: Emergency Medicine

## 2024-06-08 DIAGNOSIS — R079 Chest pain, unspecified: Secondary | ICD-10-CM | POA: Diagnosis present

## 2024-06-08 DIAGNOSIS — Z5321 Procedure and treatment not carried out due to patient leaving prior to being seen by health care provider: Secondary | ICD-10-CM | POA: Insufficient documentation

## 2024-06-08 DIAGNOSIS — R0602 Shortness of breath: Secondary | ICD-10-CM | POA: Insufficient documentation

## 2024-06-08 DIAGNOSIS — R059 Cough, unspecified: Secondary | ICD-10-CM | POA: Insufficient documentation

## 2024-06-08 LAB — CBC WITH DIFFERENTIAL/PLATELET
Abs Immature Granulocytes: 0.04 K/uL (ref 0.00–0.07)
Basophils Absolute: 0.1 K/uL (ref 0.0–0.1)
Basophils Relative: 1 %
Eosinophils Absolute: 0.2 K/uL (ref 0.0–0.5)
Eosinophils Relative: 2 %
HCT: 38.9 % (ref 36.0–46.0)
Hemoglobin: 13.4 g/dL (ref 12.0–15.0)
Immature Granulocytes: 0 %
Lymphocytes Relative: 19 %
Lymphs Abs: 2 K/uL (ref 0.7–4.0)
MCH: 30 pg (ref 26.0–34.0)
MCHC: 34.4 g/dL (ref 30.0–36.0)
MCV: 87.2 fL (ref 80.0–100.0)
Monocytes Absolute: 0.5 K/uL (ref 0.1–1.0)
Monocytes Relative: 5 %
Neutro Abs: 7.5 K/uL (ref 1.7–7.7)
Neutrophils Relative %: 73 %
Platelets: 238 K/uL (ref 150–400)
RBC: 4.46 MIL/uL (ref 3.87–5.11)
RDW: 12.1 % (ref 11.5–15.5)
WBC: 10.3 K/uL (ref 4.0–10.5)
nRBC: 0 % (ref 0.0–0.2)

## 2024-06-08 LAB — RESP PANEL BY RT-PCR (RSV, FLU A&B, COVID)  RVPGX2
Influenza A by PCR: NEGATIVE
Influenza B by PCR: NEGATIVE
Resp Syncytial Virus by PCR: NEGATIVE
SARS Coronavirus 2 by RT PCR: NEGATIVE

## 2024-06-08 LAB — COMPREHENSIVE METABOLIC PANEL WITH GFR
ALT: 10 U/L (ref 0–44)
AST: 17 U/L (ref 15–41)
Albumin: 4.5 g/dL (ref 3.5–5.0)
Alkaline Phosphatase: 74 U/L (ref 38–126)
Anion gap: 14 (ref 5–15)
BUN: 13 mg/dL (ref 6–20)
CO2: 21 mmol/L — ABNORMAL LOW (ref 22–32)
Calcium: 9.3 mg/dL (ref 8.9–10.3)
Chloride: 106 mmol/L (ref 98–111)
Creatinine, Ser: 0.79 mg/dL (ref 0.44–1.00)
GFR, Estimated: >60 mL/min (ref >60)
Glucose, Bld: 89 mg/dL (ref 70–99)
Potassium: 3.7 mmol/L (ref 3.5–5.1)
Sodium: 141 mmol/L (ref 135–145)
Total Bilirubin: 0.2 mg/dL (ref 0.0–1.2)
Total Protein: 6.8 g/dL (ref 6.5–8.1)

## 2024-06-08 LAB — TROPONIN T, HIGH SENSITIVITY: Troponin T High Sensitivity: <15 ng/L (ref 0–19)

## 2024-06-08 NOTE — ED Notes (Signed)
 Patient with good air movement bilaterally. SATs 100% on RA. Non-productive cough.

## 2024-06-08 NOTE — ED Triage Notes (Signed)
 Pt c/o intermittent LT CP that started suddenly tonight; arrives to triage with forceful cough and reporting difficulty breathing; O2 sats 100%, RT in to assess and reports lungs CTA
